# Patient Record
Sex: Male | Born: 1945
Health system: Southern US, Community
[De-identification: ages and names within clinical notes are randomized; demographics above are authoritative.]

## PROBLEM LIST (undated history)

## (undated) DIAGNOSIS — E785 Hyperlipidemia, unspecified: Secondary | ICD-10-CM

## (undated) DIAGNOSIS — I10 Essential (primary) hypertension: Secondary | ICD-10-CM

## (undated) HISTORY — DX: Hyperlipidemia, unspecified: E78.5

## (undated) HISTORY — DX: Essential (primary) hypertension: I10

---

## 2005-05-19 ENCOUNTER — Ambulatory Visit: Payer: Self-pay | Admitting: Endocrinology

## 2005-07-31 ENCOUNTER — Ambulatory Visit: Payer: Self-pay | Admitting: Endocrinology

## 2005-08-02 ENCOUNTER — Ambulatory Visit: Payer: Self-pay | Admitting: Endocrinology

## 2005-09-26 ENCOUNTER — Ambulatory Visit: Payer: Self-pay | Admitting: Endocrinology

## 2006-09-13 ENCOUNTER — Ambulatory Visit: Payer: Self-pay | Admitting: Endocrinology

## 2007-08-17 ENCOUNTER — Encounter: Payer: Self-pay | Admitting: *Deleted

## 2007-08-17 DIAGNOSIS — K279 Peptic ulcer, site unspecified, unspecified as acute or chronic, without hemorrhage or perforation: Secondary | ICD-10-CM | POA: Insufficient documentation

## 2007-08-17 DIAGNOSIS — E1159 Type 2 diabetes mellitus with other circulatory complications: Secondary | ICD-10-CM | POA: Insufficient documentation

## 2007-08-17 DIAGNOSIS — I152 Hypertension secondary to endocrine disorders: Secondary | ICD-10-CM | POA: Insufficient documentation

## 2007-08-17 DIAGNOSIS — E1169 Type 2 diabetes mellitus with other specified complication: Secondary | ICD-10-CM | POA: Insufficient documentation

## 2007-08-17 DIAGNOSIS — E785 Hyperlipidemia, unspecified: Secondary | ICD-10-CM | POA: Insufficient documentation

## 2007-08-17 DIAGNOSIS — E119 Type 2 diabetes mellitus without complications: Secondary | ICD-10-CM | POA: Insufficient documentation

## 2007-08-17 DIAGNOSIS — D72819 Decreased white blood cell count, unspecified: Secondary | ICD-10-CM | POA: Insufficient documentation

## 2007-08-17 DIAGNOSIS — I10 Essential (primary) hypertension: Secondary | ICD-10-CM

## 2007-09-11 ENCOUNTER — Ambulatory Visit: Payer: Self-pay | Admitting: Endocrinology

## 2007-09-11 LAB — CONVERTED CEMR LAB
ALT: 18 units/L (ref 0–53)
AST: 19 units/L (ref 0–37)
Albumin: 4.3 g/dL (ref 3.5–5.2)
Alkaline Phosphatase: 59 units/L (ref 39–117)
BUN: 8 mg/dL (ref 6–23)
Basophils Absolute: 0 10*3/uL (ref 0.0–0.1)
Basophils Relative: 0.3 % (ref 0.0–1.0)
Bilirubin Urine: NEGATIVE
Bilirubin, Direct: 0.1 mg/dL (ref 0.0–0.3)
CO2: 27 meq/L (ref 19–32)
Calcium: 9.5 mg/dL (ref 8.4–10.5)
Chloride: 108 meq/L (ref 96–112)
Cholesterol: 147 mg/dL (ref 0–200)
Creatinine, Ser: 1 mg/dL (ref 0.4–1.5)
Eosinophils Absolute: 0.1 10*3/uL (ref 0.0–0.6)
Eosinophils Relative: 2.7 % (ref 0.0–5.0)
GFR calc Af Amer: 97 mL/min
GFR calc non Af Amer: 80 mL/min
Glucose, Bld: 145 mg/dL — ABNORMAL HIGH (ref 70–99)
HCT: 39.4 % (ref 39.0–52.0)
HDL: 52 mg/dL (ref >39.0)
Hemoglobin, Urine: NEGATIVE
Hemoglobin: 13.3 g/dL (ref 13.0–17.0)
Hgb A1c MFr Bld: 6.1 % — ABNORMAL HIGH (ref 4.6–6.0)
Ketones, ur: NEGATIVE mg/dL
LDL Cholesterol: 88 mg/dL (ref 0–99)
Leukocytes, UA: NEGATIVE
Lymphocytes Relative: 41.6 % (ref 12.0–46.0)
MCHC: 33.7 g/dL (ref 30.0–36.0)
MCV: 82.7 fL (ref 78.0–100.0)
Monocytes Absolute: 0.4 10*3/uL (ref 0.2–0.7)
Monocytes Relative: 9.7 % (ref 3.0–11.0)
Neutro Abs: 2.2 10*3/uL (ref 1.4–7.7)
Neutrophils Relative %: 45.7 % (ref 43.0–77.0)
Nitrite: NEGATIVE
PSA: 0.91 ng/mL (ref 0.10–4.00)
Platelets: 240 10*3/uL (ref 150–400)
Potassium: 5.2 meq/L — ABNORMAL HIGH (ref 3.5–5.1)
RBC: 4.77 M/uL (ref 4.22–5.81)
RDW: 13.7 % (ref 11.5–14.6)
Sodium: 141 meq/L (ref 135–145)
Specific Gravity, Urine: 1.01 (ref 1.000–1.03)
TSH: 1.8 microintl units/mL (ref 0.35–5.50)
Total Bilirubin: 0.6 mg/dL (ref 0.3–1.2)
Total CHOL/HDL Ratio: 2.8
Total Protein, Urine: NEGATIVE mg/dL
Total Protein: 7.1 g/dL (ref 6.0–8.3)
Triglycerides: 36 mg/dL (ref 0–149)
Urine Glucose: NEGATIVE mg/dL
Urobilinogen, UA: 0.2 (ref 0.0–1.0)
VLDL: 7 mg/dL (ref 0–40)
WBC: 4.6 10*3/uL (ref 4.5–10.5)
pH: 6 (ref 5.0–8.0)

## 2008-08-10 ENCOUNTER — Telehealth: Payer: Self-pay | Admitting: Endocrinology

## 2008-09-04 ENCOUNTER — Ambulatory Visit: Payer: Self-pay | Admitting: Endocrinology

## 2008-09-08 ENCOUNTER — Encounter: Payer: Self-pay | Admitting: Endocrinology

## 2008-09-11 ENCOUNTER — Encounter: Payer: Self-pay | Admitting: Endocrinology

## 2009-11-16 ENCOUNTER — Telehealth: Payer: Self-pay | Admitting: Endocrinology

## 2009-12-09 ENCOUNTER — Ambulatory Visit: Payer: Self-pay | Admitting: Endocrinology

## 2009-12-09 DIAGNOSIS — F17200 Nicotine dependence, unspecified, uncomplicated: Secondary | ICD-10-CM

## 2009-12-14 ENCOUNTER — Telehealth: Payer: Self-pay | Admitting: Endocrinology

## 2010-03-16 ENCOUNTER — Telehealth: Payer: Self-pay | Admitting: Endocrinology

## 2010-12-11 LAB — CONVERTED CEMR LAB
ALT: 14 units/L (ref 0–53)
ALT: 14 units/L (ref 0–53)
AST: 18 units/L (ref 0–37)
AST: 23 units/L (ref 0–37)
Albumin: 4.2 g/dL (ref 3.5–5.2)
Albumin: 4.4 g/dL (ref 3.5–5.2)
Alkaline Phosphatase: 54 units/L (ref 39–117)
Alkaline Phosphatase: 56 units/L (ref 39–117)
BUN: 11 mg/dL (ref 6–23)
BUN: 9 mg/dL (ref 6–23)
Bacteria, UA: NEGATIVE
Basophils Absolute: 0 10*3/uL (ref 0.0–0.1)
Basophils Absolute: 0 10*3/uL (ref 0.0–0.1)
Basophils Relative: 0.8 % (ref 0.0–3.0)
Basophils Relative: 1.1 % (ref 0.0–3.0)
Bilirubin Urine: NEGATIVE
Bilirubin Urine: NEGATIVE
Bilirubin, Direct: 0.1 mg/dL (ref 0.0–0.3)
Bilirubin, Direct: 0.1 mg/dL (ref 0.0–0.3)
CO2: 27 meq/L (ref 19–32)
CO2: 29 meq/L (ref 19–32)
Calcium: 9.4 mg/dL (ref 8.4–10.5)
Calcium: 9.6 mg/dL (ref 8.4–10.5)
Chloride: 105 meq/L (ref 96–112)
Chloride: 109 meq/L (ref 96–112)
Cholesterol: 136 mg/dL (ref 0–200)
Cholesterol: 157 mg/dL (ref 0–200)
Creatinine, Ser: 1 mg/dL (ref 0.4–1.5)
Creatinine, Ser: 1.2 mg/dL (ref 0.4–1.5)
Creatinine,U: 24 mg/dL
Creatinine,U: 50.9 mg/dL
Crystals: NEGATIVE
Eosinophils Absolute: 0 10*3/uL (ref 0.0–0.7)
Eosinophils Absolute: 0.1 10*3/uL (ref 0.0–0.7)
Eosinophils Relative: 0.6 % (ref 0.0–5.0)
Eosinophils Relative: 1.7 % (ref 0.0–5.0)
GFR calc Af Amer: 79 mL/min
GFR calc non Af Amer: 65 mL/min
GFR calc non Af Amer: 96.47 mL/min (ref >60)
Glucose, Bld: 107 mg/dL — ABNORMAL HIGH (ref 70–99)
Glucose, Bld: 111 mg/dL — ABNORMAL HIGH (ref 70–99)
HCT: 38.5 % — ABNORMAL LOW (ref 39.0–52.0)
HCT: 41.2 % (ref 39.0–52.0)
HDL: 47.5 mg/dL (ref >39.0)
HDL: 63.6 mg/dL (ref >39.00)
Hemoglobin, Urine: NEGATIVE
Hemoglobin, Urine: NEGATIVE
Hemoglobin: 12.6 g/dL — ABNORMAL LOW (ref 13.0–17.0)
Hemoglobin: 14.2 g/dL (ref 13.0–17.0)
Hgb A1c MFr Bld: 5.9 % (ref 4.6–6.5)
Hgb A1c MFr Bld: 6 % (ref 4.6–6.0)
Ketones, ur: NEGATIVE mg/dL
Ketones, ur: NEGATIVE mg/dL
LDL Cholesterol: 102 mg/dL — ABNORMAL HIGH (ref 0–99)
LDL Cholesterol: 64 mg/dL (ref 0–99)
Leukocytes, UA: NEGATIVE
Leukocytes, UA: NEGATIVE
Lymphocytes Relative: 36.1 % (ref 12.0–46.0)
Lymphocytes Relative: 38.1 % (ref 12.0–46.0)
Lymphs Abs: 1.2 10*3/uL (ref 0.7–4.0)
MCHC: 32.6 g/dL (ref 30.0–36.0)
MCHC: 34.5 g/dL (ref 30.0–36.0)
MCV: 83.6 fL (ref 78.0–100.0)
MCV: 86.6 fL (ref 78.0–100.0)
Microalb Creat Ratio: 16.7 mg/g (ref 0.0–30.0)
Microalb Creat Ratio: 7.9 mg/g (ref 0.0–30.0)
Microalb, Ur: 0.4 mg/dL (ref 0.0–1.9)
Microalb, Ur: 0.4 mg/dL (ref 0.0–1.9)
Monocytes Absolute: 0.3 10*3/uL (ref 0.1–1.0)
Monocytes Absolute: 0.4 10*3/uL (ref 0.1–1.0)
Monocytes Relative: 10.7 % (ref 3.0–12.0)
Monocytes Relative: 9.3 % (ref 3.0–12.0)
Mucus, UA: NEGATIVE
Neutro Abs: 1.8 10*3/uL (ref 1.4–7.7)
Neutro Abs: 1.9 10*3/uL (ref 1.4–7.7)
Neutrophils Relative %: 48.4 % (ref 43.0–77.0)
Neutrophils Relative %: 53.2 % (ref 43.0–77.0)
Nitrite: NEGATIVE
Nitrite: NEGATIVE
PSA: 1.03 ng/mL (ref 0.10–4.00)
PSA: 1.14 ng/mL (ref 0.10–4.00)
Platelets: 201 10*3/uL (ref 150.0–400.0)
Platelets: 207 10*3/uL (ref 150–400)
Potassium: 3.9 meq/L (ref 3.5–5.1)
Potassium: 4.5 meq/L (ref 3.5–5.1)
RBC: 4.44 M/uL (ref 4.22–5.81)
RBC: 4.93 M/uL (ref 4.22–5.81)
RDW: 14.2 % (ref 11.5–14.6)
RDW: 14.2 % (ref 11.5–14.6)
Sodium: 141 meq/L (ref 135–145)
Sodium: 142 meq/L (ref 135–145)
Specific Gravity, Urine: 1.01 (ref 1.000–1.030)
Specific Gravity, Urine: <=1.005 (ref 1.000–1.03)
Squamous Epithelial / LPF: NEGATIVE /lpf
TSH: 1.17 microintl units/mL (ref 0.35–5.50)
TSH: 1.77 microintl units/mL (ref 0.35–5.50)
Total Bilirubin: 0.8 mg/dL (ref 0.3–1.2)
Total Bilirubin: 0.8 mg/dL (ref 0.3–1.2)
Total CHOL/HDL Ratio: 2
Total CHOL/HDL Ratio: 3.3
Total Protein, Urine: NEGATIVE mg/dL
Total Protein, Urine: NEGATIVE mg/dL
Total Protein: 6.9 g/dL (ref 6.0–8.3)
Total Protein: 7.6 g/dL (ref 6.0–8.3)
Triglycerides: 39 mg/dL (ref 0–149)
Triglycerides: 42 mg/dL (ref 0.0–149.0)
Urine Glucose: NEGATIVE mg/dL
Urine Glucose: NEGATIVE mg/dL
Urobilinogen, UA: 0.2 (ref 0.0–1.0)
Urobilinogen, UA: 0.2 (ref 0.0–1.0)
VLDL: 8 mg/dL (ref 0–40)
VLDL: 8.4 mg/dL (ref 0.0–40.0)
WBC: 3.4 10*3/uL — ABNORMAL LOW (ref 4.5–10.5)
WBC: 3.7 10*3/uL — ABNORMAL LOW (ref 4.5–10.5)
pH: 6 (ref 5.0–8.0)
pH: 6 (ref 5.0–8.0)

## 2010-12-15 NOTE — Medication Information (Signed)
Summary: Authorization not processed - Coverage no longer valid with [employer]  Authorization not processed - Coverage no longer valid with [facility]   Imported By: Maryln Gottron 09/17/2008 16:49:47  _____________________________________________________________________  External Attachment:    Type:   Image     Comment:   External Document

## 2010-12-15 NOTE — Assessment & Plan Note (Signed)
Summary: cpx/medicare/lab same day/$50/pn   Vital Signs:  Patient Profile:   65 Years Old Male Weight:      160.6 pounds O2 Sat:      98 % O2 treatment:    Room Air Temp:     96.9 degrees F oral Pulse rate:   92 / minute BP sitting:   162 / 94  (left arm) Cuff size:   regular  Pt. in pain?   no  Vitals Entered By: Orlan Leavens (September 04, 2008 11:13 AM)                  Chief Complaint:  cpx.  History of Present Illness: smoker cigar 1/week.  etoh is 1/week    Prior Medications Reviewed Using: Patient Recall  Updated Prior Medication List: GLUCOPHAGE 1000 MG  TABS (METFORMIN HCL) take 1 by mouth two times a day once daily Physical is due in October ADULT ASPIRIN LOW STRENGTH 81 MG  TBDP (ASPIRIN) take 1 by mouth qd DIOVAN HCT 160-25 MG TABS (VALSARTAN-HYDROCHLOROTHIAZIDE) take 1 by mouth qd  Current Allergies (reviewed today): ! 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     Physical Exam  General:     well developed, well nourished, in no acute distress Head:     head is normocephalic eyes: no scleral icterus no periorbital swelling perrl external ears are normal nose normal externally mouth has no lesion, including normal tongue  Neck:     no masses, thyromegaly, or abnormal cervical nodes Heart:     regular rate and rhythm, S1, S2 without murmurs, rubs, gallops, or clicks Abdomen:     abdomen is soft, nontender.  no hepatosplenomegaly.   not distended.  no hernia  Rectal:     normal external and internal  exam.  heme neg  Prostate:     normal Msk:     no deformity or scoliosis noted with normal posture and gait Extremities:     no deformity.  no ulcer on the feet.  feet are of normal color and temp.  no edema  Neurologic:     cn 2-12 grossly intact.   readily moves all 4's.   sensation is intact to touch on the feet  Skin:     normal texture and temp.  no rash.  not diaphoretic  Cervical Nodes:     no significant adenopathy Psych:     alert and cooperative; normal mood and affect; normal attention span and concentration Additional Exam:     SEPARATE EVALUATION FOLLOWS--EACH PROBLEM HERE IS NEW, NOT RESPONDING TO TREATMENT, OR POSES SIGNIFICANT RISK TO THE PATIENT'S HEALTH: HISTORY OF THE PRESENT ILLNESS: takes diovan hct as rx'ed PAST MEDICAL HISTORY reviewed and up to date today REVIEW OF SYSTEMS: denies sob PHYSICAL EXAMINATION: clear to auscultation.  no respiratory distress dorsalis pedis intact bilat.  no carotid bruit LAB/XRAY RESULTS: bmet is normal IMPRESSION: needs increased rx of htn PLAN: increase diovan-hct to 320/25, 1/d    Impression & Recommendations:  Problem #  1:  ROUTINE GENERAL MEDICAL EXAM@HEALTH  CARE FACL (ICD-V70.0)  Medications Added to Medication List This Visit: 1)  Diovan Hct 160-25 Mg Tabs (Valsartan-hydrochlorothiazide) .... Take 1 by mouth qd 2)  Diovan Hct 320-25 Mg Tabs (Valsartan-hydrochlorothiazide) .... Qd  Other Orders: EKG w/ Interpretation (93000) Admin 1st Vaccine ([address]) Flu Vaccine 44yrs + ([address]) Tetanus Toxoid w/Dx (09811) Admin of Any Addtl Vaccine (91478) TLB-TSH (Thyroid Stimulating Hormone) (84443-TSH) TLB-CBC Platelet - w/Differential (85025-CBCD) TLB-BMP (Basic Metabolic Panel-BMET) (80048-METABOL) TLB-Lipid Panel (80061-LIPID) TLB-A1C / Hgb A1C (Glycohemoglobin) (83036-A1C) TLB-Microalbumin/Creat Ratio, Urine (82043-MALB) TLB-PSA (Prostate Specific Antigen) (84153-PSA) TLB-Udip w/ Micro (81001-URINE) Est.  Patient Level III ([address])   Patient Instructions: 1)  we discussed the recommendations of the preventive services task force 2)  i strongly advised screening colonoscopy.  pt says he'll consider   Prescriptions: GLUCOPHAGE 1000 MG  TABS (METFORMIN HCL) take 1 by mouth two times a day once daily Physical is due in October  #60 x 11   Entered and Authorized by:   Minus Breeding MD   Signed by:   Minus Breeding MD on 09/04/2008   Method used:   [facility] to        [facility]  [address] [id number]       [address]       [place], [place]  [address]       Ph: [phone] or [phone]       Fax: [phone]   RxID:   [id number] DIOVAN HCT 320-25 MG TABS (VALSARTAN-HYDROCHLOROTHIAZIDE) qd  #30 x 11   Entered and Authorized by:   Minus Breeding MD   Signed by:   Minus Breeding MD on 09/04/2008   Method used:   [facility] to        [facility] [id number]       [address]       [place], [place]  [address]       Ph: [phone] or [phone]       Fax: [phone]   RxID:   [id number]  ]   Flu Vaccine Consent Questions     Do you have a history of severe allergic reactions to this vaccine? no    Any prior history of allergic reactions to egg and/or gelatin? no    Do you have a sensitivity to the preservative Thimersol? no    Do you have a past history of Guillan-Barre Syndrome? no    Do you currently have an acute febrile illness? no    Have you ever had a severe reaction to latex? no    Vaccine information given and explained to patient? yes    Are you currently pregnant? no    Lot Number:[id number]   Exp Date:05/12/2009   Site Given right Deltoid IM   Tetanus/Td Vaccine    Vaccine Type: Td    Site: left deltoid    Dose: 0.5 ml    Route: IM    Given by: Orlan Leavens    Exp. Date: 02/16/2010    Lot #: [id number]    VIS given: 09/04/08

## 2010-12-15 NOTE — Progress Notes (Signed)
Summary: metformin  Phone Note Refill Request Message from:  Fax from Pharmacy on August 10, 2008 11:44 AM  Refills Requested: Medication #1:  GLUCOPHAGE 1000 MG  TABS take 1 by mouth two times a day qd   Last Refilled: 08/04/2008  Method Requested: Electronic Initial call taken by: Orlan Leavens,  August 10, 2008 11:44 AM    New/Updated Medications: GLUCOPHAGE 1000 MG  TABS (METFORMIN HCL) take 1 by mouth two times a day once daily Physical is due in October   Prescriptions: GLUCOPHAGE 1000 MG  TABS (METFORMIN HCL) take 1 by mouth two times a day once daily Physical is due in October  #60 x 0   Entered by:   Orlan Leavens   Authorized by:   Minus Breeding MD   Signed by:   Orlan Leavens on 08/10/2008   Method used:   [facility]        [facility] [id number]       [address]       [place], [place]  [address]       Ph: [phone] or [phone]       Fax: [phone]   RxID:   [id number]

## 2010-12-15 NOTE — Progress Notes (Signed)
Summary: glucophage  Phone Note Refill Request Message from:  Fax from Pharmacy on Mar 16, 2010 10:59 AM  Refills Requested: Medication #1:  GLUCOPHAGE 1000 MG  TABS take 1 by mouth two times a day  Method Requested: Electronic Initial call taken by: Orlan Leavens,  Mar 16, 2010 10:59 AM    Prescriptions: GLUCOPHAGE 1000 MG  TABS (METFORMIN HCL) take 1 by mouth two times a day  #60 x 9   Entered by:   Orlan Leavens   Authorized by:   Minus Breeding MD   Signed by:   Orlan Leavens on 03/16/2010   Method used:   Electronically to        [facility] [address] [facility] (retail)       [address]       [place], [place]  [address]       Ph: [phone]       Fax: [phone]   RxID:   [id number]

## 2010-12-15 NOTE — Progress Notes (Signed)
  Phone Note Refill Request Message from:  Fax from Pharmacy on December 14, 2009 4:35 PM  Refills Requested: Medication #1:  GLUCOPHAGE 1000 MG  TABS take 1 by mouth two times a day   Dosage confirmed as above?Dosage Confirmed Pt wants RX [facility] [address]  Initial call taken by: Josph Macho CMA,  December 14, 2009 4:35 PM    Prescriptions: GLUCOPHAGE 1000 MG  TABS (METFORMIN HCL) take 1 by mouth two times a day  #60 x 3   Entered by:   Josph Macho CMA   Authorized by:   Minus Breeding MD   Signed by:   Josph Macho CMA on 12/14/2009   Method used:   Electronically to        [facility] (retail)       [address]       [place], [place]  [address]       Ph: [phone]       Fax: [phone]   RxID:   [id number]

## 2010-12-15 NOTE — Miscellaneous (Signed)
   Clinical Lists Changes  Allergies: Changed allergy or adverse reaction from ACE INHIBITORS to ACE INHIBITORS

## 2010-12-15 NOTE — Progress Notes (Signed)
Summary: rx refill  Phone Note Call from Patient Call back at Home Phone [phone]   Caller: Patient Summary of Call: pt called requesting refills of Metformin to last until CPX 01.27/2011. 30 day supply sent. pt informed and will get 1 year supply at CPX Initial call taken by: Margaret Pyle, CMA,  November 16, 2009 9:46 AM    Prescriptions: GLUCOPHAGE 1000 MG  TABS (METFORMIN HCL) take 1 by mouth two times a day once daily Physical is due in October  #60 Tablet x 0   Entered by:   Margaret Pyle, CMA   Authorized by:   Minus Breeding MD   Signed by:   Margaret Pyle, CMA on 11/16/2009   Method used:   [facility] [id number]       [address]       [place], [place]  [address]       Ph: [phone] or [phone]       Fax: [phone]   RxID:   [id number]

## 2010-12-15 NOTE — Medication Information (Signed)
Summary: Denied Prior Authorization Request Form for Rx Diovan/[facility]  Denied Prior Authorization Request Form for Rx Diovan/[facility]   Imported By: Esmeralda Links D'jimraou 09/15/2008 14:56:44  _____________________________________________________________________  External Attachment:    Type:   Image     Comment:   External Document

## 2010-12-15 NOTE — Assessment & Plan Note (Signed)
Summary: CPX/BCBS/#/CD   Vital Signs:  Patient profile:   65 year old male Height:      68 inches (172.72 cm) Weight:      156 pounds (70.91 kg) BMI:     23.81 O2 Sat:      98 % on Room air Temp:     97.7 degrees F (36.50 degrees C) oral Pulse rate:   102 / minute BP sitting:   180 / 94  (left arm) Cuff size:   regular  Vitals Entered By: Josph Macho CMA (December 09, 2009 11:35 AM)  O2 Flow:  Room air CC: Physical/ pt needs refills on meds/ flu vax today/ CF Is Patient Diabetic? Yes   CC:  Physical/ pt needs refills on meds/ flu vax today/ CF.  History of Present Illness: here for regular wellness examination.  He's feeling pretty well in general.   Current Medications (verified): 1)  Glucophage 1000 Mg  Tabs (Metformin Hcl) .... Take 1 By Mouth Two Times A Day Once Daily Physical Is Due in October 2)  Adult Aspirin Low Strength 81 Mg  Tbdp (Aspirin) .... Take 1 By Mouth Qd 3)  Diovan Hct 320-25 Mg Tabs (Valsartan-Hydrochlorothiazide) .... Qd 4)  Simvastatin 40 Mg Tabs (Simvastatin) .... Qhs  Allergies (verified): 1)  ! Ace Inhibitors  Past History:  Past Medical History: Last updated: 08/17/2007 Diabetes mellitus, type II Hyperlipidemia Hypertension  Family History: Reviewed history from 09/04/2008 and no changes required. no cancer  Social History: Reviewed history from 09/04/2008 and no changes required. 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    Physical Exam  General:  normal appearance.   Head:  head: no deformity eyes: no periorbital swelling, no proptosis external nose and ears are normal mouth: no lesion seen Neck:  Supple without  thyroid enlargement or tenderness. No cervical lymphadenopathy Lungs:  Clear to auscultation bilaterally. Normal respiratory effort.  Heart:  Regular rate and rhythm without murmurs or gallops noted. Normal S1,S2.   Abdomen:  abdomen is soft, nontender.  no hepatosplenomegaly.   not distended.  no hernia  Rectal:  normal external and internal exam.  heme neg  Prostate:  Normal size prostate without masses or tenderness.  Msk:  muscle bulk and strength are grossly normal.  no obvious joint swelling.  gait is normal and steady  Pulses:  dorsalis pedis intact bilat.  no carotid bruit  Extremities:  no deformity.  no ulcer on the feet.  feet are of normal color and temp.  no edema  Neurologic:  cn 2-12 grossly intact.   readily moves all 4's.   sensation is intact to touch on the feet  Skin:  normal texture and temp.  no rash.  not diaphoretic  Cervical Nodes:  No significant adenopathy.  Psych:  Alert and cooperative; normal mood and affect; normal attention span and concentration.     Impression & Recommendations:  Problem # 1:  ROUTINE GENERAL MEDICAL EXAM@HEALTH  CARE FACL (ICD-V70.0)  Orders: Est. Patient 40-64 years ([address])  Medications Added to Medication List This Visit: 1)  Glucophage 1000 Mg Tabs (Metformin hcl) .... Take 1 by mouth two times a day  Other Orders: Admin 1st Vaccine ([address]) Flu Vaccine 23yrs + (09811) TLB-Lipid Panel (80061-LIPID) TLB-BMP (Basic Metabolic  Panel-BMET) (80048-METABOL) TLB-CBC Platelet - w/Differential (85025-CBCD) TLB-Hepatic/Liver Function Pnl (80076-HEPATIC) TLB-TSH (Thyroid Stimulating Hormone) (84443-TSH) TLB-A1C / Hgb A1C (Glycohemoglobin) (83036-A1C) TLB-Microalbumin/Creat Ratio, Urine (82043-MALB) TLB-PSA (Prostate Specific Antigen) (84153-PSA) TLB-Udip w/ Micro (81001-URINE) Flu Vaccine Consent Questions     Do you have a history of severe allergic reactions to this vaccine? no    Any prior history of allergic reactions to egg  and/or gelatin? no    Do you have a sensitivity to the preservative Thimersol? no    Do you have a past history of Guillan-Barre Syndrome? no    Do you currently have an acute febrile illness? no    Have you ever had a severe reaction to latex? no    Vaccine information given and explained to patient? yes    Are you currently pregnant? no    Lot Number:[id number]   Exp Date:05/12/2010   Site Given  Left Deltoid IM Flu Vaccine 27yrs + ([address])  Patient Instructions: 1)  return 6 months 2)  tests are being ordered for you today.  a few days after the test(s), please call [phone] to hear your test results. 3)  you should avoid smoking cigars. 4)  (update: i left message on phone-tree:  please come in for cbc iron panel b-12 (all 285.9) Prescriptions: SIMVASTATIN 40 MG TABS (SIMVASTATIN) qhs  #90 x 3   Entered and Authorized by:   Minus Breeding MD   Signed by:   Minus Breeding MD on 12/09/2009   Method used:   Electronically to        [facility] [place] (retail)       [address]       [place], [place]  [address]       Ph: [phone]       Fax: [phone]   RxID:   [id number] SIMVASTATIN 40 MG TABS (SIMVASTATIN) qhs  #90 x 3   Entered and Authorized by:   Minus Breeding MD   Signed by:   Minus Breeding MD on 12/09/2009   Method used:   Print then Give to Patient   RxID:   [id number]  .lbflu

## 2011-01-16 ENCOUNTER — Telehealth: Payer: Self-pay | Admitting: Endocrinology

## 2011-01-24 NOTE — Progress Notes (Signed)
Summary: rx refill req  Phone Note Refill Request Message from:  Pharmacy on January 16, 2011 12:04 PM  Refills Requested: Medication #1:  GLUCOPHAGE 1000 MG  TABS take 1 by mouth two times a day   Dosage confirmed as above?Dosage Confirmed   Last Refilled: 01/14/2011  Method Requested: Electronic Next Appointment Scheduled: none Initial call taken by: Brenton Grills CMA (AAMA),  January 16, 2011 12:05 PM    Prescriptions: GLUCOPHAGE 1000 MG  TABS (METFORMIN HCL) take 1 by mouth two times a day  #60 x 0   Entered by:   Brenton Grills CMA (AAMA)   Authorized by:   Minus Breeding MD   Signed by:   Brenton Grills CMA (AAMA) on 01/16/2011   Method used:   Electronically to        [facility] [place] (retail)       [address]       [place], [place]  [address]       Ph: [phone]       Fax: [phone]   RxID:   [id number]

## 2011-02-10 ENCOUNTER — Other Ambulatory Visit: Payer: Self-pay

## 2011-02-10 MED ORDER — METFORMIN HCL 1000 MG PO TABS: 1000.0000 mg | ORAL_TABLET | Freq: Two times a day (BID) | ORAL | Status: DC

## 2011-02-10 NOTE — Telephone Encounter (Signed)
Pt called requesting refill, appt scheduled 04/16

## 2011-03-06 ENCOUNTER — Encounter: Payer: Self-pay | Admitting: Endocrinology

## 2011-03-06 ENCOUNTER — Other Ambulatory Visit (INDEPENDENT_AMBULATORY_CARE_PROVIDER_SITE_OTHER): Payer: PRIVATE HEALTH INSURANCE

## 2011-03-06 ENCOUNTER — Ambulatory Visit (INDEPENDENT_AMBULATORY_CARE_PROVIDER_SITE_OTHER): Payer: PRIVATE HEALTH INSURANCE | Admitting: Endocrinology

## 2011-03-06 DIAGNOSIS — D72819 Decreased white blood cell count, unspecified: Secondary | ICD-10-CM

## 2011-03-06 DIAGNOSIS — E119 Type 2 diabetes mellitus without complications: Secondary | ICD-10-CM

## 2011-03-06 DIAGNOSIS — Z0000 Encounter for general adult medical examination without abnormal findings: Secondary | ICD-10-CM

## 2011-03-06 DIAGNOSIS — Z125 Encounter for screening for malignant neoplasm of prostate: Secondary | ICD-10-CM

## 2011-03-06 DIAGNOSIS — E785 Hyperlipidemia, unspecified: Secondary | ICD-10-CM

## 2011-03-06 DIAGNOSIS — Z23 Encounter for immunization: Secondary | ICD-10-CM

## 2011-03-06 DIAGNOSIS — I10 Essential (primary) hypertension: Secondary | ICD-10-CM

## 2011-03-06 DIAGNOSIS — Z79899 Other long term (current) drug therapy: Secondary | ICD-10-CM

## 2011-03-06 LAB — BASIC METABOLIC PANEL
BUN: 11 mg/dL (ref 6–23)
CO2: 26 mEq/L (ref 19–32)
Calcium: 9.6 mg/dL (ref 8.4–10.5)
Chloride: 100 mEq/L (ref 96–112)
Creatinine, Ser: 1.2 mg/dL (ref 0.4–1.5)
GFR: 74.97 mL/min (ref >60.00)
Glucose, Bld: 138 mg/dL — ABNORMAL HIGH (ref 70–99)
Potassium: 4.8 mEq/L (ref 3.5–5.1)
Sodium: 135 mEq/L (ref 135–145)

## 2011-03-06 LAB — CBC WITH DIFFERENTIAL/PLATELET
Basophils Absolute: 0 10*3/uL (ref 0.0–0.1)
Basophils Relative: 0.4 % (ref 0.0–3.0)
Eosinophils Absolute: 0.1 10*3/uL (ref 0.0–0.7)
Eosinophils Relative: 1.8 % (ref 0.0–5.0)
HCT: 38.9 % — ABNORMAL LOW (ref 39.0–52.0)
Hemoglobin: 13.3 g/dL (ref 13.0–17.0)
Lymphocytes Relative: 32.7 % (ref 12.0–46.0)
Lymphs Abs: 1 10*3/uL (ref 0.7–4.0)
MCHC: 34.1 g/dL (ref 30.0–36.0)
MCV: 84.1 fl (ref 78.0–100.0)
Monocytes Absolute: 0.3 10*3/uL (ref 0.1–1.0)
Monocytes Relative: 9.2 % (ref 3.0–12.0)
Neutro Abs: 1.7 10*3/uL (ref 1.4–7.7)
Neutrophils Relative %: 55.9 % (ref 43.0–77.0)
Platelets: 253 10*3/uL (ref 150.0–400.0)
RBC: 4.62 Mil/uL (ref 4.22–5.81)
RDW: 15.6 % — ABNORMAL HIGH (ref 11.5–14.6)
WBC: 3.1 10*3/uL — ABNORMAL LOW (ref 4.5–10.5)

## 2011-03-06 LAB — HEPATIC FUNCTION PANEL
ALT: 11 U/L (ref 0–53)
AST: 18 U/L (ref 0–37)
Albumin: 4.4 g/dL (ref 3.5–5.2)
Alkaline Phosphatase: 52 U/L (ref 39–117)
Bilirubin, Direct: 0.1 mg/dL (ref 0.0–0.3)
Total Bilirubin: 0.6 mg/dL (ref 0.3–1.2)
Total Protein: 7.2 g/dL (ref 6.0–8.3)

## 2011-03-06 LAB — LIPID PANEL
Cholesterol: 157 mg/dL (ref 0–200)
HDL: 69.4 mg/dL (ref >39.00)
LDL Cholesterol: 82 mg/dL (ref 0–99)
Total CHOL/HDL Ratio: 2
Triglycerides: 30 mg/dL (ref 0.0–149.0)
VLDL: 6 mg/dL (ref 0.0–40.0)

## 2011-03-06 LAB — HEMOGLOBIN A1C: Hgb A1c MFr Bld: 6 % (ref 4.6–6.5)

## 2011-03-06 LAB — MICROALBUMIN / CREATININE URINE RATIO
Creatinine,U: 35.1 mg/dL
Microalb Creat Ratio: 0.3 mg/g (ref 0.0–30.0)
Microalb, Ur: 0.1 mg/dL (ref 0.0–1.9)

## 2011-03-06 LAB — PSA: PSA: 1.44 ng/mL (ref 0.10–4.00)

## 2011-03-06 LAB — TSH: TSH: 1.91 u[IU]/mL (ref 0.35–5.50)

## 2011-03-06 MED ORDER — SIMVASTATIN 40 MG PO TABS: 40.0000 mg | ORAL_TABLET | Freq: Every day | ORAL | Status: DC

## 2011-03-06 MED ORDER — METFORMIN HCL 1000 MG PO TABS: 1000.0000 mg | ORAL_TABLET | Freq: Two times a day (BID) | ORAL | Status: DC

## 2011-03-06 NOTE — Patient Instructions (Addendum)
blood tests are being ordered for you today.  please call [phone] to hear your test results. please consider these measures for your health:  minimize alcohol.  do not use tobacco products.  have a colonoscopy at least every 10 years from age 66.  keep firearms safely stored.  always use seat belts.  have working smoke alarms in your home.  see an eye doctor and dentist regularly.  never drive under the influence of alcohol or drugs (including prescription drugs).  those with fair skin should take precautions against the sun. please let me know what your wishes would be, if artificial life support measures should become necessary.  it is critically important to prevent falling down (keep floor areas well-lit, dry, and free of loose objects) Please return in 1 year. i have requested a colonoscopy for you.  you will be called with a day and time for an appointment (update: i left message on phone-tree:  rx as we discussed)

## 2011-03-06 NOTE — Progress Notes (Signed)
  Subjective:    Patient ID: Ricardo Dawson, male    DOB: 03/19/1946, 65 y.o.   MRN: [id number]  HPI here for regular wellness examination.  He's feeling pretty well in general, and says chronic med probs are stable, except as noted below Past Medical History  Diagnosis Date  . Diabetes mellitus     Type II  . Hyperlipidemia   . Hypertension    No past surgical history on file.  does not have a smoking history on file. He does not have any smokeless tobacco history on file. He reports that he drinks alcohol. He reports that he does not use illicit drugs. Married Retired  family history is negative for Cancer. Allergies  Allergen Reactions  . Ace Inhibitors     REACTION: cough 2002--altace      Review of Systems  Constitutional: Negative for unexpected weight change.  HENT: Negative for hearing loss.   Eyes: Negative for visual disturbance.  Respiratory: Negative for shortness of breath.   Cardiovascular: Negative for chest pain.  Gastrointestinal: Negative for blood in stool.  Genitourinary: Negative for hematuria.  Musculoskeletal: Negative for arthralgias.  Skin: Negative for rash.  Neurological: Negative for syncope.  Hematological: Does not bruise/bleed easily.  Psychiatric/Behavioral: Negative for dysphoric mood. The patient is not nervous/anxious.        Objective:   Physical Exam VS: see vs page GEN: no distress HEAD: head: no deformity eyes: no periorbital swelling, no proptosis external nose and ears are normal mouth: no lesion seen NECK: supple, thyroid is not enlarged CHEST WALL: no deformity BREASTS:  No gynecomastia CV: reg rate and rhythm, no murmur ABD: abdomen is soft, nontender.  no hepatosplenomegaly.  not distended.  no hernia RECTAL: normal external and internal exam.  heme neg. PROSTATE:  Normal size.  No nodule MUSCULOSKELETAL: muscle bulk and strength are grossly normal.  no obvious joint swelling.  gait is normal and steady EXTEMITIES:  no deformity.  no ulcer on the feet.  feet are of normal color and temp.  no edema PULSES: dorsalis pedis intact bilat.  no carotid bruit NEURO:  cn 2-12 grossly intact.   readily moves all 4's.  sensation is intact to touch on the feet SKIN:  Normal texture and temperature.  No rash or suspicious lesion is visible.   NODES:  None palpable at the neck PSYCH: alert, oriented x3.  Does not appear anxious nor depressed.        Assessment & Plan:  Wellness visit today, with problems stable except as noted.

## 2011-12-19 ENCOUNTER — Other Ambulatory Visit: Payer: Self-pay

## 2011-12-19 MED ORDER — SIMVASTATIN 40 MG PO TABS: 40.0000 mg | ORAL_TABLET | Freq: Every day | ORAL | Status: DC

## 2012-02-08 ENCOUNTER — Other Ambulatory Visit: Payer: Self-pay | Admitting: Endocrinology

## 2012-04-09 ENCOUNTER — Other Ambulatory Visit: Payer: Self-pay | Admitting: *Deleted

## 2012-04-09 MED ORDER — METFORMIN HCL 1000 MG PO TABS: ORAL_TABLET | ORAL | Status: DC

## 2012-04-09 NOTE — Telephone Encounter (Signed)
R"cd fax from [facility] for refill of Metformin.

## 2012-05-07 ENCOUNTER — Ambulatory Visit (INDEPENDENT_AMBULATORY_CARE_PROVIDER_SITE_OTHER): Payer: Medicare Other | Admitting: Endocrinology

## 2012-05-07 ENCOUNTER — Other Ambulatory Visit (INDEPENDENT_AMBULATORY_CARE_PROVIDER_SITE_OTHER): Payer: Medicare Other

## 2012-05-07 ENCOUNTER — Encounter: Payer: Self-pay | Admitting: Endocrinology

## 2012-05-07 VITALS — BP 148/96 | HR 84 | Temp 97.3°F | Ht 67.0 in | Wt 152.0 lb

## 2012-05-07 DIAGNOSIS — Z79899 Other long term (current) drug therapy: Secondary | ICD-10-CM

## 2012-05-07 DIAGNOSIS — D72819 Decreased white blood cell count, unspecified: Secondary | ICD-10-CM | POA: Diagnosis not present

## 2012-05-07 DIAGNOSIS — I10 Essential (primary) hypertension: Secondary | ICD-10-CM | POA: Diagnosis not present

## 2012-05-07 DIAGNOSIS — E119 Type 2 diabetes mellitus without complications: Secondary | ICD-10-CM

## 2012-05-07 DIAGNOSIS — Z125 Encounter for screening for malignant neoplasm of prostate: Secondary | ICD-10-CM

## 2012-05-07 DIAGNOSIS — E785 Hyperlipidemia, unspecified: Secondary | ICD-10-CM

## 2012-05-07 DIAGNOSIS — Z0000 Encounter for general adult medical examination without abnormal findings: Secondary | ICD-10-CM

## 2012-05-07 LAB — CBC WITH DIFFERENTIAL/PLATELET
Basophils Absolute: 0 10*3/uL (ref 0.0–0.1)
Basophils Relative: 0.6 % (ref 0.0–3.0)
Eosinophils Absolute: 0 10*3/uL (ref 0.0–0.7)
Eosinophils Relative: 0.7 % (ref 0.0–5.0)
HCT: 37.9 % — ABNORMAL LOW (ref 39.0–52.0)
Hemoglobin: 12.5 g/dL — ABNORMAL LOW (ref 13.0–17.0)
Lymphocytes Relative: 31.9 % (ref 12.0–46.0)
Lymphs Abs: 0.9 10*3/uL (ref 0.7–4.0)
MCHC: 33 g/dL (ref 30.0–36.0)
MCV: 84.6 fl (ref 78.0–100.0)
Monocytes Absolute: 0.3 10*3/uL (ref 0.1–1.0)
Monocytes Relative: 10.3 % (ref 3.0–12.0)
Neutro Abs: 1.6 10*3/uL (ref 1.4–7.7)
Neutrophils Relative %: 56.5 % (ref 43.0–77.0)
Platelets: 206 10*3/uL (ref 150.0–400.0)
RBC: 4.48 Mil/uL (ref 4.22–5.81)
RDW: 15.4 % — ABNORMAL HIGH (ref 11.5–14.6)
WBC: 2.8 10*3/uL — ABNORMAL LOW (ref 4.5–10.5)

## 2012-05-07 LAB — HEPATIC FUNCTION PANEL
ALT: 13 U/L (ref 0–53)
AST: 18 U/L (ref 0–37)
Albumin: 4.4 g/dL (ref 3.5–5.2)
Alkaline Phosphatase: 45 U/L (ref 39–117)
Bilirubin, Direct: 0.1 mg/dL (ref 0.0–0.3)
Total Bilirubin: 0.8 mg/dL (ref 0.3–1.2)
Total Protein: 7.3 g/dL (ref 6.0–8.3)

## 2012-05-07 LAB — LIPID PANEL
Cholesterol: 93 mg/dL (ref 0–200)
HDL: 65.5 mg/dL (ref >39.00)
LDL Cholesterol: 23 mg/dL (ref 0–99)
Total CHOL/HDL Ratio: 1
Triglycerides: 23 mg/dL (ref 0.0–149.0)
VLDL: 4.6 mg/dL (ref 0.0–40.0)

## 2012-05-07 LAB — BASIC METABOLIC PANEL
BUN: 7 mg/dL (ref 6–23)
CO2: 30 mEq/L (ref 19–32)
Calcium: 9.6 mg/dL (ref 8.4–10.5)
Chloride: 105 mEq/L (ref 96–112)
Creatinine, Ser: 1.1 mg/dL (ref 0.4–1.5)
GFR: 87.62 mL/min (ref >60.00)
Glucose, Bld: 100 mg/dL — ABNORMAL HIGH (ref 70–99)
Potassium: 4.7 mEq/L (ref 3.5–5.1)
Sodium: 142 mEq/L (ref 135–145)

## 2012-05-07 LAB — HEMOGLOBIN A1C: Hgb A1c MFr Bld: 6 % (ref 4.6–6.5)

## 2012-05-07 LAB — URINALYSIS, ROUTINE W REFLEX MICROSCOPIC
Bilirubin Urine: NEGATIVE
Hgb urine dipstick: NEGATIVE
Ketones, ur: NEGATIVE
Leukocytes, UA: NEGATIVE
Nitrite: NEGATIVE
Specific Gravity, Urine: <=1.005 (ref 1.000–1.030)
Total Protein, Urine: NEGATIVE
Urine Glucose: NEGATIVE
Urobilinogen, UA: 0.2 (ref 0.0–1.0)
pH: 6 (ref 5.0–8.0)

## 2012-05-07 LAB — PSA, MEDICARE: PSA: 1.17 ng/mL (ref <=4.00)

## 2012-05-07 LAB — MICROALBUMIN / CREATININE URINE RATIO
Creatinine,U: 29.3 mg/dL
Microalb Creat Ratio: 2.4 mg/g (ref 0.0–30.0)
Microalb, Ur: 0.7 mg/dL (ref 0.0–1.9)

## 2012-05-07 LAB — TSH: TSH: 1.67 u[IU]/mL (ref 0.35–5.50)

## 2012-05-07 MED ORDER — METFORMIN HCL 1000 MG PO TABS: ORAL_TABLET | ORAL | Status: DC

## 2012-05-07 MED ORDER — VALSARTAN-HYDROCHLOROTHIAZIDE 320-25 MG PO TABS: 1.0000 | ORAL_TABLET | Freq: Every day | ORAL | Status: DC

## 2012-05-07 MED ORDER — SIMVASTATIN 40 MG PO TABS: 40.0000 mg | ORAL_TABLET | Freq: Every day | ORAL | Status: DC

## 2012-05-07 NOTE — Patient Instructions (Addendum)
Please come back soon for a "medicare wellness" visit.  We'll recheck your blood-pressure then.  blood tests are being requested for you today.  You will receive a letter with results.

## 2012-05-07 NOTE — Progress Notes (Signed)
  Subjective:    Patient ID: Ricardo Dawson, male    DOB: [birth year], 66 y.o.   MRN: [id number]  HPI The state of at least three ongoing medical problems is addressed today: HTN: Pt says today's bp elev is situational.  Denies chest pain DM: Denies weight change Leukopenia: denies fever Past Medical History  Diagnosis Date  . Diabetes mellitus     Type II  . Hyperlipidemia   . Hypertension     No past surgical history on file.  History   Social History  . Marital Status: Married    Spouse Name: N/A    Number of Children: N/A  . Years of Education: N/A   Occupational History  . Not on file.   Social History Main Topics  . Smoking status: Current Some Day Smoker    Types: Cigars  . Smokeless tobacco: Not on file  . Alcohol Use: Yes     Rare  . Drug Use: No  . Sexually Active: Not on file   Other Topics Concern  . Not on file   Social History Narrative  . No narrative on file    Current Outpatient Prescriptions on File Prior to Visit  Medication Sig Dispense Refill  . aspirin 81 MG tablet Take 81 mg by mouth daily.        . metFORMIN (GLUCOPHAGE) 1000 MG tablet TAKE 1 TABLET (1,000 MG TOTAL) BY MOUTH 2 (TWO) TIMES DAILY WITH A MEAL.  60 tablet  5  . simvastatin (ZOCOR) 40 MG tablet Take 1 tablet (40 mg total) by mouth at bedtime.  90 tablet  1  . valsartan-hydrochlorothiazide (DIOVAN-HCT) 320-25 MG per tablet Take 1 tablet by mouth daily.  30 tablet  5    Allergies  Allergen Reactions  . Ace Inhibitors     REACTION: cough 2002--altace    Family History  Problem Relation Age of Onset  . Cancer Neg Hx     BP 148/96  Pulse 84  Temp 97.3 F (36.3 C) (Oral)  Ht 5\' 7"  (1.702 m)  Wt 152 lb (68.947 kg)  BMI 23.81 kg/m2  SpO2 97%  Review of Systems Denies sob and brbpr.    Objective:   Physical Exam VITAL SIGNS:  See vs page GENERAL: no distress Pulses: dorsalis pedis intact bilat.   Feet: no deformity.  no ulcer on the feet.  feet are of normal  color and temp.  no edema Neuro: sensation is intact to touch on the feet. Lab Results  Component Value Date   WBC 2.8* 05/07/2012   HGB 12.5* 05/07/2012   HCT 37.9* 05/07/2012   PLT 206.0 05/07/2012   GLUCOSE 100* 05/07/2012   CHOL 93 05/07/2012   TRIG 23.0 05/07/2012   HDL 65.50 05/07/2012   LDLCALC 23 05/07/2012   ALT 13 05/07/2012   AST 18 05/07/2012   NA 142 05/07/2012   K 4.7 05/07/2012   CL 105 05/07/2012   CREATININE 1.1 05/07/2012   BUN 7 05/07/2012   CO2 30 05/07/2012   TSH 1.67 05/07/2012   PSA 1.17 05/07/2012   HGBA1C 6.0 05/07/2012   MICROALBUR 0.7 05/07/2012      Assessment & Plan:  HTN, with ? Of situational component DM.  well-controlled Leukopenia.   Worse, but still not at a dangerous level

## 2012-05-08 ENCOUNTER — Telehealth: Payer: Self-pay | Admitting: *Deleted

## 2012-05-08 NOTE — Telephone Encounter (Signed)
Called pt to inform of lab results, no answer/unable to leave message (letter also mailed to pt).  

## 2012-05-10 NOTE — Telephone Encounter (Signed)
Called pt to inform, no answer/unable to leave message.  

## 2012-05-13 NOTE — Telephone Encounter (Signed)
Called pt to inform of lab results, no answer/unable to leave message (mailbox full)-letter also mailed to pt's address. Closing phone note.

## 2012-06-18 ENCOUNTER — Encounter: Payer: Medicare Other | Admitting: Endocrinology

## 2012-06-18 DIAGNOSIS — Z0289 Encounter for other administrative examinations: Secondary | ICD-10-CM

## 2012-11-01 ENCOUNTER — Telehealth: Payer: Self-pay

## 2012-11-01 ENCOUNTER — Other Ambulatory Visit: Payer: Self-pay

## 2012-11-01 MED ORDER — METFORMIN HCL 1000 MG PO TABS: ORAL_TABLET | ORAL | Status: DC

## 2012-11-01 NOTE — Telephone Encounter (Signed)
Pt calling for refill of metformin, [facility] [address].

## 2012-12-02 ENCOUNTER — Other Ambulatory Visit: Payer: Self-pay | Admitting: Endocrinology

## 2013-03-04 ENCOUNTER — Other Ambulatory Visit: Payer: Self-pay | Admitting: *Deleted

## 2013-03-04 ENCOUNTER — Other Ambulatory Visit: Payer: Self-pay | Admitting: Endocrinology

## 2013-03-04 MED ORDER — SIMVASTATIN 40 MG PO TABS: 40.0000 mg | ORAL_TABLET | Freq: Every day | ORAL | Status: DC

## 2013-03-31 ENCOUNTER — Other Ambulatory Visit: Payer: Self-pay | Admitting: Endocrinology

## 2013-04-30 ENCOUNTER — Other Ambulatory Visit: Payer: Self-pay | Admitting: Endocrinology

## 2013-04-30 ENCOUNTER — Other Ambulatory Visit: Payer: Self-pay | Admitting: *Deleted

## 2013-04-30 MED ORDER — METFORMIN HCL 1000 MG PO TABS: ORAL_TABLET | ORAL | Status: DC

## 2013-05-30 ENCOUNTER — Other Ambulatory Visit: Payer: Self-pay | Admitting: *Deleted

## 2013-05-30 ENCOUNTER — Other Ambulatory Visit: Payer: Self-pay | Admitting: Endocrinology

## 2013-05-30 MED ORDER — METFORMIN HCL 1000 MG PO TABS: ORAL_TABLET | ORAL | Status: DC

## 2013-06-27 ENCOUNTER — Encounter: Payer: Self-pay | Admitting: Endocrinology

## 2013-06-27 ENCOUNTER — Ambulatory Visit (INDEPENDENT_AMBULATORY_CARE_PROVIDER_SITE_OTHER): Payer: Medicare Other | Admitting: Endocrinology

## 2013-06-27 VITALS — BP 140/82 | HR 78 | Ht 67.0 in | Wt 156.0 lb

## 2013-06-27 DIAGNOSIS — E119 Type 2 diabetes mellitus without complications: Secondary | ICD-10-CM

## 2013-06-27 DIAGNOSIS — Z125 Encounter for screening for malignant neoplasm of prostate: Secondary | ICD-10-CM

## 2013-06-27 DIAGNOSIS — E785 Hyperlipidemia, unspecified: Secondary | ICD-10-CM

## 2013-06-27 DIAGNOSIS — I10 Essential (primary) hypertension: Secondary | ICD-10-CM | POA: Diagnosis not present

## 2013-06-27 DIAGNOSIS — D72819 Decreased white blood cell count, unspecified: Secondary | ICD-10-CM | POA: Diagnosis not present

## 2013-06-27 DIAGNOSIS — D649 Anemia, unspecified: Secondary | ICD-10-CM

## 2013-06-27 DIAGNOSIS — Z79899 Other long term (current) drug therapy: Secondary | ICD-10-CM | POA: Diagnosis not present

## 2013-06-27 DIAGNOSIS — Z0000 Encounter for general adult medical examination without abnormal findings: Secondary | ICD-10-CM

## 2013-06-27 LAB — URINALYSIS, ROUTINE W REFLEX MICROSCOPIC
Bilirubin Urine: NEGATIVE
Hgb urine dipstick: NEGATIVE
Ketones, ur: NEGATIVE
Leukocytes, UA: NEGATIVE
Nitrite: NEGATIVE
Specific Gravity, Urine: 1.01 (ref 1.000–1.030)
Total Protein, Urine: NEGATIVE
Urine Glucose: NEGATIVE
Urobilinogen, UA: 0.2 (ref 0.0–1.0)
pH: 6 (ref 5.0–8.0)

## 2013-06-27 LAB — BASIC METABOLIC PANEL
BUN: 8 mg/dL (ref 6–23)
CO2: 28 mEq/L (ref 19–32)
Calcium: 9.4 mg/dL (ref 8.4–10.5)
Chloride: 104 mEq/L (ref 96–112)
Creatinine, Ser: 1 mg/dL (ref 0.4–1.5)
GFR: 98.85 mL/min (ref >60.00)
Glucose, Bld: 92 mg/dL (ref 70–99)
Potassium: 3.9 mEq/L (ref 3.5–5.1)
Sodium: 138 mEq/L (ref 135–145)

## 2013-06-27 LAB — CBC WITH DIFFERENTIAL/PLATELET
Basophils Absolute: 0 10*3/uL (ref 0.0–0.1)
Basophils Relative: 0.5 % (ref 0.0–3.0)
Eosinophils Absolute: 0 10*3/uL (ref 0.0–0.7)
Eosinophils Relative: 0.8 % (ref 0.0–5.0)
HCT: 37.2 % — ABNORMAL LOW (ref 39.0–52.0)
Hemoglobin: 12.4 g/dL — ABNORMAL LOW (ref 13.0–17.0)
Lymphocytes Relative: 32.8 % (ref 12.0–46.0)
Lymphs Abs: 1.3 10*3/uL (ref 0.7–4.0)
MCHC: 33.4 g/dL (ref 30.0–36.0)
MCV: 89.2 fl (ref 78.0–100.0)
Monocytes Absolute: 0.4 10*3/uL (ref 0.1–1.0)
Monocytes Relative: 9.8 % (ref 3.0–12.0)
Neutro Abs: 2.2 10*3/uL (ref 1.4–7.7)
Neutrophils Relative %: 56.1 % (ref 43.0–77.0)
Platelets: 196 10*3/uL (ref 150.0–400.0)
RBC: 4.17 Mil/uL — ABNORMAL LOW (ref 4.22–5.81)
RDW: 15.3 % — ABNORMAL HIGH (ref 11.5–14.6)
WBC: 4 10*3/uL — ABNORMAL LOW (ref 4.5–10.5)

## 2013-06-27 LAB — LIPID PANEL
Cholesterol: 95 mg/dL (ref 0–200)
HDL: 58 mg/dL (ref >39.00)
LDL Cholesterol: 31 mg/dL (ref 0–99)
Total CHOL/HDL Ratio: 2
Triglycerides: 28 mg/dL (ref 0.0–149.0)
VLDL: 5.6 mg/dL (ref 0.0–40.0)

## 2013-06-27 LAB — HEPATIC FUNCTION PANEL
ALT: 13 U/L (ref 0–53)
AST: 22 U/L (ref 0–37)
Albumin: 4.3 g/dL (ref 3.5–5.2)
Alkaline Phosphatase: 42 U/L (ref 39–117)
Bilirubin, Direct: 0.1 mg/dL (ref 0.0–0.3)
Total Bilirubin: 0.8 mg/dL (ref 0.3–1.2)
Total Protein: 6.9 g/dL (ref 6.0–8.3)

## 2013-06-27 LAB — MICROALBUMIN / CREATININE URINE RATIO
Creatinine,U: 68.7 mg/dL
Microalb Creat Ratio: 1 mg/g (ref 0.0–30.0)
Microalb, Ur: 0.7 mg/dL (ref 0.0–1.9)

## 2013-06-27 LAB — TSH: TSH: 1.55 u[IU]/mL (ref 0.35–5.50)

## 2013-06-27 LAB — PSA, MEDICARE: PSA: 1.28 ng/ml (ref 0.10–4.00)

## 2013-06-27 LAB — HEMOGLOBIN A1C: Hgb A1c MFr Bld: 5.9 % (ref 4.6–6.5)

## 2013-06-27 MED ORDER — VALSARTAN-HYDROCHLOROTHIAZIDE 320-25 MG PO TABS: 1.0000 | ORAL_TABLET | Freq: Every day | ORAL | Status: DC

## 2013-06-27 MED ORDER — METFORMIN HCL 1000 MG PO TABS: ORAL_TABLET | ORAL | Status: DC

## 2013-06-27 MED ORDER — SIMVASTATIN 40 MG PO TABS: 40.0000 mg | ORAL_TABLET | Freq: Every day | ORAL | Status: DC

## 2013-06-27 NOTE — Progress Notes (Signed)
Subjective:    Patient ID: Ricardo Dawson, male    DOB: Dec 22, 1945, 67 y.o.   MRN: [id number]  HPI The state of at least three ongoing medical problems is addressed today, with interval history of each noted here: HTN: pt says he does not take diovan-hct as rx'ed.  Denies sob Dyslipidemia: he denies chest pain. DM: he denies numbness. Past Medical History  Diagnosis Date  . Diabetes mellitus     Type II  . Hyperlipidemia   . Hypertension     No past surgical history on file.  History   Social History  . Marital Status: Married    Spouse Name: N/A    Number of Children: N/A  . Years of Education: N/A   Occupational History  . Not on file.   Social History Main Topics  . Smoking status: Current Some Day Smoker    Types: Cigars  . Smokeless tobacco: Not on file  . Alcohol Use: Yes     Comment: Rare  . Drug Use: No  . Sexual Activity: Not on file   Other Topics Concern  . Not on file   Social History Narrative  . No narrative on file    Current Outpatient Prescriptions on File Prior to Visit  Medication Sig Dispense Refill  . aspirin 81 MG tablet Take 81 mg by mouth daily.         No current facility-administered medications on file prior to visit.    Allergies  Allergen Reactions  . Ace Inhibitors     REACTION: cough 2002--altace    Family History  Problem Relation Age of Onset  . Cancer Neg Hx     BP 140/82  Pulse 78  Ht 5\' 7"  (1.702 m)  Wt 156 lb (70.761 kg)  BMI 24.43 kg/m2  SpO2 97%  Review of Systems Denies weight change and n/v    Objective:   Physical Exam VITAL SIGNS:  See vs page GENERAL: no distress NECK: There is no palpable thyroid enlargement.  No thyroid nodule is palpable.  No palpable lymphadenopathy at the anterior neck. LUNGS:  Clear to auscultation HEART:  Regular rate and rhythm without murmurs noted. Normal S1,S2.   Pulses: no carotid bruit  Lab Results  Component Value Date   WBC 4.0* 06/27/2013   HGB 12.4*  06/27/2013   HCT 37.2* 06/27/2013   PLT 196.0 06/27/2013   GLUCOSE 92 06/27/2013   CHOL 95 06/27/2013   TRIG 28.0 06/27/2013   HDL 58.00 06/27/2013   LDLCALC 31 06/27/2013   ALT 13 06/27/2013   AST 22 06/27/2013   NA 138 06/27/2013   K 3.9 06/27/2013   CL 104 06/27/2013   CREATININE 1.0 06/27/2013   BUN 8 06/27/2013   CO2 28 06/27/2013   TSH 1.55 06/27/2013   PSA 1.28 06/27/2013   HGBA1C 5.9 06/27/2013   MICROALBUR 0.7 06/27/2013      Assessment & Plan:  Dyslipidemia: well-controlled HTN: therapy limited by noncompliance.  i'll do the best i can. DM: well-controlled   Subjective:   Patient here for Medicare annual wellness visit and management of other chronic and acute problems.     Risk factors: advanced age    Roster of Physicians Providing Medical Care to Patient:  See "snapshot"   Activities of Daily Living: In your present state of health, do you have any difficulty performing the following activities?:  Preparing food and eating?: No  Bathing yourself: No  Getting dressed: No  Using the  toilet:No  Moving around from place to place: No  In the past year have you fallen or had a near fall?:No    Home Safety: Has smoke detector and wears seat belts. No firearms. No excess sun exposure.  Diet and Exercise  Current exercise habits: pt says good Dietary issues discussed: pt reports a healthy diet   Depression Screen  Q1: Over the past two weeks, have you felt down, depressed or hopeless? no  Q2: Over the past two weeks, have you felt little interest or pleasure in doing things? no   The following portions of the patient's history were reviewed and updated as appropriate: allergies, current medications, past family history, past medical history, past social history, past surgical history and problem list.  Past Medical History  Diagnosis Date  . Diabetes mellitus     Type II  . Hyperlipidemia   . Hypertension     No past surgical history on file.  History   Social  History  . Marital Status: Married    Spouse Name: N/A    Number of Children: N/A  . Years of Education: N/A   Occupational History  . Not on file.   Social History Main Topics  . Smoking status: Current Some Day Smoker    Types: Cigars  . Smokeless tobacco: Not on file  . Alcohol Use: Yes     Comment: Rare  . Drug Use: No  . Sexual Activity: Not on file   Other Topics Concern  . Not on file   Social History Narrative  . No narrative on file    Current Outpatient Prescriptions on File Prior to Visit  Medication Sig Dispense Refill  . aspirin 81 MG tablet Take 81 mg by mouth daily.         No current facility-administered medications on file prior to visit.    Allergies  Allergen Reactions  . Ace Inhibitors     REACTION: cough 2002--altace    Family History  Problem Relation Age of Onset  . Cancer Neg Hx     BP 140/82  Pulse 78  Ht 5\' 7"  (1.702 m)  Wt 156 lb (70.761 kg)  BMI 24.43 kg/m2  SpO2 97%   Review of Systems  Denies hearing loss, and visual loss Objective:   Vision:  Sees opthalmologist Hearing: grossly normal Body mass index:  See vs page Msk: pt easily and quickly performs "get-up-and-go" from a sitting position Cognitive Impairment Assessment: cognition, memory and judgment appear normal.  remembers 3/3 at 5 minutes.  excellent recall.  can easily read and write a sentence.  alert and oriented x 3.    Assessment:   Medicare wellness utd on preventive parameters.     Plan:   During the course of the visit the patient was educated and counseled about appropriate screening and preventive services including:        Fall prevention Diabetes screening  Nutrition counseling   Vaccines / LABS Zostavax / Pnemonccoal Vaccine  today  PSA  Patient Instructions (the written plan) was given to the patient.   we discussed code status.  pt requests full code, but would not want to be started or maintained on artificial life-support measures if  there was not a reasonable chance of recovery.

## 2013-06-27 NOTE — Patient Instructions (Addendum)
please consider these measures for your health:  minimize alcohol.  do not use tobacco products.  have a colonoscopy at least every 10 years from age 67.  keep firearms safely stored.  always use seat belts.  have working smoke alarms in your home.  see an eye doctor and dentist regularly.  never drive under the influence of alcohol or drugs (including prescription drugs).  good diet and exercise habits significanly improve the control of your diabetes.  please let me know if you wish to be referred to a dietician.  high blood sugar is very risky to your health.  you should see an eye doctor every year.  You are at higher than average risk for pneumonia and hepatitis-B.  You should be vaccinated against both.   please let me know what your wishes would be, if artificial life support measures should become necessary.  it is critically important to prevent falling down (keep floor areas well-lit, dry, and free of loose objects.  If you have a cane, walker, or wheelchair, you should use it, even for short trips around the house.  Also, try not to rush).   blood tests are being requested for you today.  We'll contact you with results.   Refer for a colonoscopy.  you will receive a phone call, about a day and time for an appointment Please come back for a follow-up appointment in 6 months.

## 2013-06-28 DIAGNOSIS — D649 Anemia, unspecified: Secondary | ICD-10-CM | POA: Insufficient documentation

## 2013-06-30 ENCOUNTER — Ambulatory Visit: Payer: Medicare Other

## 2013-06-30 DIAGNOSIS — D649 Anemia, unspecified: Secondary | ICD-10-CM

## 2013-06-30 LAB — IBC PANEL
Iron: 66 ug/dL (ref 42–165)
Saturation Ratios: 16.6 % — ABNORMAL LOW (ref 20.0–50.0)
Transferrin: 283.8 mg/dL (ref 212.0–360.0)

## 2013-07-15 ENCOUNTER — Other Ambulatory Visit: Payer: Self-pay | Admitting: Endocrinology

## 2013-07-15 MED ORDER — LOSARTAN POTASSIUM-HCTZ 100-25 MG PO TABS: 1.0000 | ORAL_TABLET | Freq: Every day | ORAL | Status: DC

## 2013-08-01 ENCOUNTER — Other Ambulatory Visit: Payer: Self-pay | Admitting: Endocrinology

## 2013-08-07 ENCOUNTER — Encounter: Payer: Self-pay | Admitting: Gastroenterology

## 2013-08-12 ENCOUNTER — Other Ambulatory Visit: Payer: Self-pay

## 2013-08-12 ENCOUNTER — Other Ambulatory Visit: Payer: Medicare Other

## 2013-08-12 LAB — HEMOCCULT SLIDES (X 3 CARDS)
Fecal Occult Blood: NEGATIVE
OCCULT 1: NEGATIVE
OCCULT 2: NEGATIVE
OCCULT 3: NEGATIVE
OCCULT 4: NEGATIVE
OCCULT 5: NEGATIVE

## 2013-11-29 ENCOUNTER — Other Ambulatory Visit: Payer: Self-pay | Admitting: Endocrinology

## 2013-12-27 ENCOUNTER — Other Ambulatory Visit: Payer: Self-pay | Admitting: Endocrinology

## 2013-12-28 ENCOUNTER — Other Ambulatory Visit: Payer: Self-pay | Admitting: Endocrinology

## 2014-03-01 ENCOUNTER — Other Ambulatory Visit: Payer: Self-pay | Admitting: Endocrinology

## 2014-04-09 ENCOUNTER — Other Ambulatory Visit: Payer: Self-pay | Admitting: Endocrinology

## 2014-04-30 ENCOUNTER — Other Ambulatory Visit: Payer: Self-pay | Admitting: Endocrinology

## 2014-04-30 MED ORDER — METFORMIN HCL 500 MG PO TABS: 500.0000 mg | ORAL_TABLET | Freq: Two times a day (BID) | ORAL | Status: DC

## 2014-04-30 MED ORDER — GLIMEPIRIDE 2 MG PO TABS: 2.0000 mg | ORAL_TABLET | Freq: Every day | ORAL | Status: DC

## 2014-05-30 ENCOUNTER — Other Ambulatory Visit: Payer: Self-pay | Admitting: Endocrinology

## 2014-06-04 ENCOUNTER — Telehealth: Payer: Self-pay

## 2014-06-04 NOTE — Telephone Encounter (Signed)
LVM for pt to call back and schedule CPE with PCP.   /Diabetic Bundle pt./ 

## 2014-06-25 ENCOUNTER — Telehealth: Payer: Self-pay

## 2014-06-25 NOTE — Telephone Encounter (Signed)
Diabetic Bundle. Requested call back schedule appointment with Dr. Everardo AllEllison.

## 2014-07-10 ENCOUNTER — Ambulatory Visit (INDEPENDENT_AMBULATORY_CARE_PROVIDER_SITE_OTHER): Payer: Medicare Other | Admitting: Endocrinology

## 2014-07-10 ENCOUNTER — Encounter: Payer: Self-pay | Admitting: Endocrinology

## 2014-07-10 VITALS — BP 160/110 | HR 86 | Temp 98.0°F | Ht 67.0 in | Wt 147.0 lb

## 2014-07-10 DIAGNOSIS — Z125 Encounter for screening for malignant neoplasm of prostate: Secondary | ICD-10-CM | POA: Diagnosis not present

## 2014-07-10 DIAGNOSIS — A699 Spirochetal infection, unspecified: Secondary | ICD-10-CM | POA: Diagnosis not present

## 2014-07-10 DIAGNOSIS — R9431 Abnormal electrocardiogram [ECG] [EKG]: Secondary | ICD-10-CM

## 2014-07-10 DIAGNOSIS — E119 Type 2 diabetes mellitus without complications: Secondary | ICD-10-CM | POA: Diagnosis not present

## 2014-07-10 DIAGNOSIS — D649 Anemia, unspecified: Secondary | ICD-10-CM

## 2014-07-10 DIAGNOSIS — Z0000 Encounter for general adult medical examination without abnormal findings: Secondary | ICD-10-CM | POA: Diagnosis not present

## 2014-07-10 DIAGNOSIS — D72819 Decreased white blood cell count, unspecified: Secondary | ICD-10-CM

## 2014-07-10 DIAGNOSIS — I10 Essential (primary) hypertension: Secondary | ICD-10-CM | POA: Diagnosis not present

## 2014-07-10 DIAGNOSIS — E785 Hyperlipidemia, unspecified: Secondary | ICD-10-CM

## 2014-07-10 DIAGNOSIS — Z79899 Other long term (current) drug therapy: Secondary | ICD-10-CM

## 2014-07-10 LAB — LIPID PANEL
Cholesterol: 95 mg/dL (ref 0–200)
HDL: 60.4 mg/dL (ref >39.00)
LDL Cholesterol: 28 mg/dL (ref 0–99)
NonHDL: 34.6
Total CHOL/HDL Ratio: 2
Triglycerides: 32 mg/dL (ref 0.0–149.0)
VLDL: 6.4 mg/dL (ref 0.0–40.0)

## 2014-07-10 LAB — IBC PANEL
Iron: 83 ug/dL (ref 42–165)
Saturation Ratios: 20.8 % (ref 20.0–50.0)
Transferrin: 285.3 mg/dL (ref 212.0–360.0)

## 2014-07-10 LAB — CBC WITH DIFFERENTIAL/PLATELET
Basophils Absolute: 0 10*3/uL (ref 0.0–0.1)
Basophils Relative: 0.7 % (ref 0.0–3.0)
Eosinophils Absolute: 0.1 10*3/uL (ref 0.0–0.7)
Eosinophils Relative: 1.3 % (ref 0.0–5.0)
HCT: 39 % (ref 39.0–52.0)
Hemoglobin: 12.9 g/dL — ABNORMAL LOW (ref 13.0–17.0)
Lymphocytes Relative: 30.6 % (ref 12.0–46.0)
Lymphs Abs: 1.3 10*3/uL (ref 0.7–4.0)
MCHC: 33 g/dL (ref 30.0–36.0)
MCV: 92.7 fl (ref 78.0–100.0)
Monocytes Absolute: 0.4 10*3/uL (ref 0.1–1.0)
Monocytes Relative: 9.5 % (ref 3.0–12.0)
Neutro Abs: 2.5 10*3/uL (ref 1.4–7.7)
Neutrophils Relative %: 57.9 % (ref 43.0–77.0)
Platelets: 220 10*3/uL (ref 150.0–400.0)
RBC: 4.21 Mil/uL — ABNORMAL LOW (ref 4.22–5.81)
RDW: 15.3 % (ref 11.5–15.5)
WBC: 4.3 10*3/uL (ref 4.0–10.5)

## 2014-07-10 LAB — PSA, MEDICARE: PSA: 1.49 ng/ml (ref 0.10–4.00)

## 2014-07-10 LAB — BASIC METABOLIC PANEL
BUN: 11 mg/dL (ref 6–23)
CO2: 26 mEq/L (ref 19–32)
Calcium: 9.8 mg/dL (ref 8.4–10.5)
Chloride: 104 mEq/L (ref 96–112)
Creatinine, Ser: 1 mg/dL (ref 0.4–1.5)
GFR: 100.94 mL/min (ref >60.00)
Glucose, Bld: 108 mg/dL — ABNORMAL HIGH (ref 70–99)
Potassium: 4.6 mEq/L (ref 3.5–5.1)
Sodium: 140 mEq/L (ref 135–145)

## 2014-07-10 LAB — URINALYSIS, ROUTINE W REFLEX MICROSCOPIC
Bilirubin Urine: NEGATIVE
Hgb urine dipstick: NEGATIVE
Ketones, ur: NEGATIVE
Leukocytes, UA: NEGATIVE
Nitrite: NEGATIVE
Specific Gravity, Urine: <=1.005 — AB (ref 1.000–1.030)
Total Protein, Urine: NEGATIVE
Urine Glucose: NEGATIVE
Urobilinogen, UA: 0.2 (ref 0.0–1.0)
pH: 5.5 (ref 5.0–8.0)

## 2014-07-10 LAB — HEPATIC FUNCTION PANEL
ALT: 12 U/L (ref 0–53)
AST: 21 U/L (ref 0–37)
Albumin: 4.4 g/dL (ref 3.5–5.2)
Alkaline Phosphatase: 52 U/L (ref 39–117)
Bilirubin, Direct: 0.1 mg/dL (ref 0.0–0.3)
Total Bilirubin: 0.7 mg/dL (ref 0.2–1.2)
Total Protein: 7.4 g/dL (ref 6.0–8.3)

## 2014-07-10 LAB — HEMOGLOBIN A1C: Hgb A1c MFr Bld: 5.8 % (ref 4.6–6.5)

## 2014-07-10 LAB — MICROALBUMIN / CREATININE URINE RATIO
Creatinine,U: 45.6 mg/dL
Microalb Creat Ratio: 0.4 mg/g (ref 0.0–30.0)
Microalb, Ur: 0.2 mg/dL (ref 0.0–1.9)

## 2014-07-10 LAB — TSH: TSH: 1.47 u[IU]/mL (ref 0.35–4.50)

## 2014-07-10 MED ORDER — CARVEDILOL 3.125 MG PO TABS: 3.1250 mg | ORAL_TABLET | Freq: Two times a day (BID) | ORAL | Status: DC

## 2014-07-10 MED ORDER — SIMVASTATIN 10 MG PO TABS: 10.0000 mg | ORAL_TABLET | Freq: Every day | ORAL | Status: DC

## 2014-07-10 NOTE — Progress Notes (Signed)
Subjective:    Patient ID: Ricardo Dawson, male    DOB: [birth year], 68 y.o.   MRN: [id number]  HPI The state of at least three ongoing medical problems is addressed today, with interval history of each noted here: HTN: Pt says he never misses BP meds.  Denies edema.   Dyslipidemia: denies chest pain. DM: he denies weight change. Past Medical History  Diagnosis Date  . Diabetes mellitus     Type II  . Hyperlipidemia   . Hypertension     No past surgical history on file.  History   Social History  . Marital Status: Married    Spouse Name: N/A    Number of Children: N/A  . Years of Education: N/A   Occupational History  . Not on file.   Social History Main Topics  . Smoking status: Current Some Day Smoker    Types: Cigars  . Smokeless tobacco: Not on file  . Alcohol Use: Yes     Comment: Rare  . Drug Use: No  . Sexual Activity: Not on file   Other Topics Concern  . Not on file   Social History Narrative  . No narrative on file    Current Outpatient Prescriptions on File Prior to Visit  Medication Sig Dispense Refill  . aspirin 81 MG tablet Take 81 mg by mouth daily.        Marland Kitchen glimepiride (AMARYL) 2 MG tablet Take 1 tablet (2 mg total) by mouth daily before breakfast.  30 tablet  1  . losartan-hydrochlorothiazide (HYZAAR) 100-25 MG per tablet Take 1 tablet by mouth daily.  90 tablet  3  . metFORMIN (GLUCOPHAGE) 1000 MG tablet TAKE 1 TABLET (1,000 MG TOTAL) BY MOUTH 2 (TWO) TIMES DAILY WITH A MEAL.  60 tablet  0  . metFORMIN (GLUCOPHAGE) 500 MG tablet Take 1 tablet (500 mg total) by mouth 2 (two) times daily with a meal.  60 tablet  1   No current facility-administered medications on file prior to visit.    Allergies  Allergen Reactions  . Ace Inhibitors     REACTION: cough 2002--altace    Family History  Problem Relation Age of Onset  . Cancer Neg Hx     BP 160/110  Pulse 86  Temp(Src) 98 F (36.7 C) (Oral)  Ht  (1.702 m)  Wt 147 lb (66.679  kg)  BMI 23.02 kg/m2  SpO2 96%   Review of Systems Denies sob and hypoglycemia.      Objective:   Physical Exam VITAL SIGNS:  See vs page GENERAL: no distress LUNGS:  Clear to auscultation HEART:  Regular rate and rhythm without murmurs noted. Normal S1,S2.   Pulses: dorsalis pedis intact bilat.   Feet: no deformity. normal color and temp.  no edema Skin:  no ulcer on the feet.   Neuro: sensation is intact to touch on the feet.     Lab Results  Component Value Date   WBC 4.3 07/10/2014   HGB 12.9* 07/10/2014   HCT 39.0 07/10/2014   PLT 220.0 07/10/2014   GLUCOSE 108* 07/10/2014   CHOL 95 07/10/2014   TRIG 32.0 07/10/2014   HDL 60.40 07/10/2014   LDLCALC 28 07/10/2014   ALT 12 07/10/2014   AST 21 07/10/2014   NA 140 07/10/2014   K 4.6 07/10/2014   CL 104 07/10/2014   CREATININE 1.0 07/10/2014   BUN 11 07/10/2014   CO2 26 07/10/2014   TSH 1.47 07/10/2014  PSA 1.49 07/10/2014   HGBA1C 5.8 07/10/2014   MICROALBUR 0.2 07/10/2014       Assessment & Plan:  HTN: moderate exacerbation:i have sent a prescription to your pharmacy, for an additional blood pressure pill. Please have the blood pressure rechecked here in 2 weeks.  Dyslipidemia: overcontrolled DM: well-controlled    Subjective:   Patient here for Medicare annual wellness visit and management of other chronic and acute problems.     Risk factors: advanced age    Roster of Physicians Providing Medical Care to Patient:  See "snapshot"   Activities of Daily Living: In your present state of health, do you have any difficulty performing the following activities?:  Preparing food and eating?: No  Bathing yourself: No  Getting dressed: No  Using the toilet:No  Moving around from place to place: No  In the past year have you fallen or had a near fall?:No    Home Safety: Has smoke detector and wears seat belts. Firearms are safely stored.   Diet and Exercise  Current exercise habits: pt says good. Dietary issues discussed:  pt reports a healthy diet   Depression Screen  Q1: Over the past two weeks, have you felt down, depressed or hopeless?no  Q2: Over the past two weeks, have you felt little interest or pleasure in doing things? no   The following portions of the patient's history were reviewed and updated as appropriate: allergies, current medications, past family history, past medical history, past social history, past surgical history and problem list.  Past Medical History  Diagnosis Date  . Diabetes mellitus     Type II  . Hyperlipidemia   . Hypertension     No past surgical history on file.  History   Social History  . Marital Status: Married    Spouse Name: N/A    Number of Children: N/A  . Years of Education: N/A   Occupational History  . Not on file.   Social History Main Topics  . Smoking status: Current Some Day Smoker    Types: Cigars  . Smokeless tobacco: Not on file  . Alcohol Use: Yes     Comment: Rare  . Drug Use: No  . Sexual Activity: Not on file   Other Topics Concern  . Not on file   Social History Narrative  . No narrative on file    Current Outpatient Prescriptions on File Prior to Visit  Medication Sig Dispense Refill  . aspirin 81 MG tablet Take 81 mg by mouth daily.        Marland Kitchen glimepiride (AMARYL) 2 MG tablet Take 1 tablet (2 mg total) by mouth daily before breakfast.  30 tablet  1  . losartan-hydrochlorothiazide (HYZAAR) 100-25 MG per tablet Take 1 tablet by mouth daily.  90 tablet  3  . metFORMIN (GLUCOPHAGE) 1000 MG tablet TAKE 1 TABLET (1,000 MG TOTAL) BY MOUTH 2 (TWO) TIMES DAILY WITH A MEAL.  60 tablet  0  . metFORMIN (GLUCOPHAGE) 500 MG tablet Take 1 tablet (500 mg total) by mouth 2 (two) times daily with a meal.  60 tablet  1  . simvastatin (ZOCOR) 40 MG tablet TAKE 1 TABLET (40 MG TOTAL) BY MOUTH AT BEDTIME.  90 tablet  0   No current facility-administered medications on file prior to visit.    Allergies  Allergen Reactions  . Ace Inhibitors      REACTION: cough 2002--altace    Family History  Problem Relation Age of Onset  .  Cancer Neg Hx     BP 160/110  Pulse 86  Temp(Src) 98 F (36.7 C) (Oral)  Ht  (1.702 m)  Wt 147 lb (66.679 kg)  BMI 23.02 kg/m2  SpO2 96%   Review of Systems  Denies hearing loss, and visual loss Objective:   Vision:  Sees opthalmologist Hearing: grossly normal Body mass index:  See vs page Msk: pt easily and quickly performs "get-up-and-go" from a sitting position Cognitive Impairment Assessment: cognition, memory and judgment appear normal.  remembers 2/3 at 5 minutes (? Effort).  excellent recall.  can easily read and write a sentence.  alert and oriented x 3   Assessment:   Medicare wellness utd on preventive parameters    Plan:   During the course of the visit the patient was educated and counseled about appropriate screening and preventive services including:        Fall prevention    Diabetes screening  Nutrition counseling   Vaccines / LABS Zostavax / Pneumococcal Vaccine  today  PSA  Patient Instructions (the written plan) was given to the patient.   we discussed code status.  pt requests full code, but would not want to be started or maintained on artificial life-support measures if there was not a reasonable chance of recovery

## 2014-07-10 NOTE — Patient Instructions (Addendum)
i have sent a prescription to your pharmacy, for an additional blood pressure pill.  Please have the blood pressure rechecked here in 2 weeks.   please consider these measures for your health:  minimize alcohol.  do not use tobacco products.  have a colonoscopy at least every 10 years from age 68.   keep firearms safely stored.  always use seat belts.  have working smoke alarms in your home.  see an eye doctor and dentist regularly.  never drive under the influence of alcohol or drugs (including prescription drugs).   it is critically important to prevent falling down (keep floor areas well-lit, dry, and free of loose objects.  If you have a cane, walker, or wheelchair, you should use it, even for short trips around the house.  Also, try not to rush). good diet and exercise habits significanly improve the control of your diabetes.  please let me know if you wish to be referred to a dietician.  high blood sugar is very risky to your health.  you should see an eye doctor and dentist every year.  You are at higher than average risk for pneumonia and hepatitis-B.  You should be vaccinated against both.   blood tests are being requested for you today.  We'll contact you with results. Please come back for a follow-up appointment in 6 months.

## 2014-07-26 ENCOUNTER — Other Ambulatory Visit: Payer: Self-pay | Admitting: Endocrinology

## 2014-08-14 ENCOUNTER — Encounter: Payer: Self-pay | Admitting: Endocrinology

## 2014-08-26 ENCOUNTER — Telehealth: Payer: Self-pay | Admitting: Endocrinology

## 2014-08-26 NOTE — Telephone Encounter (Signed)
See below, Thanks!  

## 2014-08-26 NOTE — Telephone Encounter (Signed)
Patient would like to know if he can still take the ASA while taking the new B/P medication    Please advise patient    Thank You

## 2014-09-21 ENCOUNTER — Other Ambulatory Visit: Payer: Self-pay | Admitting: Endocrinology

## 2014-10-12 ENCOUNTER — Other Ambulatory Visit: Payer: Self-pay

## 2014-10-12 MED ORDER — SIMVASTATIN 10 MG PO TABS: 10.0000 mg | ORAL_TABLET | Freq: Every day | ORAL | Status: DC

## 2014-10-26 DIAGNOSIS — H2512 Age-related nuclear cataract, left eye: Secondary | ICD-10-CM | POA: Diagnosis not present

## 2014-10-26 DIAGNOSIS — E119 Type 2 diabetes mellitus without complications: Secondary | ICD-10-CM | POA: Diagnosis not present

## 2014-11-03 ENCOUNTER — Telehealth: Payer: Self-pay

## 2014-11-03 NOTE — Telephone Encounter (Signed)
Requested call back from pt to discuss Flu Shot.

## 2014-11-11 DIAGNOSIS — H2512 Age-related nuclear cataract, left eye: Secondary | ICD-10-CM | POA: Diagnosis not present

## 2014-11-25 ENCOUNTER — Other Ambulatory Visit: Payer: Self-pay | Admitting: Endocrinology

## 2014-11-30 ENCOUNTER — Other Ambulatory Visit: Payer: Self-pay | Admitting: Ophthalmology

## 2014-11-30 DIAGNOSIS — H5340 Unspecified visual field defects: Secondary | ICD-10-CM

## 2014-12-09 ENCOUNTER — Ambulatory Visit
Admission: RE | Admit: 2014-12-09 | Discharge: 2014-12-09 | Disposition: A | Discharge status: Home / Self Care | Payer: Medicare Other | Source: Ambulatory Visit | Attending: Ophthalmology | Admitting: Ophthalmology

## 2014-12-09 DIAGNOSIS — H5340 Unspecified visual field defects: Secondary | ICD-10-CM

## 2014-12-18 LAB — HM DIABETES EYE EXAM

## 2014-12-22 ENCOUNTER — Ambulatory Visit
Admission: RE | Admit: 2014-12-22 | Discharge: 2014-12-22 | Disposition: A | Discharge status: Home / Self Care | Payer: Medicare Other | Source: Ambulatory Visit | Attending: Ophthalmology | Admitting: Ophthalmology

## 2014-12-22 MED ORDER — GADOBENATE DIMEGLUMINE 529 MG/ML IV SOLN
14.0000 mL | Freq: Once | INTRAVENOUS | Status: AC | PRN
  Administered 2014-12-22: 14 mL via INTRAVENOUS

## 2015-01-11 ENCOUNTER — Ambulatory Visit: Payer: Medicare Other | Admitting: Endocrinology

## 2015-01-13 ENCOUNTER — Encounter: Payer: Self-pay | Admitting: Endocrinology

## 2015-01-13 ENCOUNTER — Ambulatory Visit (INDEPENDENT_AMBULATORY_CARE_PROVIDER_SITE_OTHER): Payer: Medicare Other | Admitting: Endocrinology

## 2015-01-13 VITALS — BP 150/102 | HR 68 | Temp 97.9°F | Ht 67.0 in | Wt 146.0 lb

## 2015-01-13 DIAGNOSIS — E119 Type 2 diabetes mellitus without complications: Secondary | ICD-10-CM | POA: Diagnosis not present

## 2015-01-13 LAB — HEMOGLOBIN A1C: Hgb A1c MFr Bld: 5.7 % (ref 4.6–6.5)

## 2015-01-13 MED ORDER — CARVEDILOL 6.25 MG PO TABS: 6.2500 mg | ORAL_TABLET | Freq: Two times a day (BID) | ORAL | Status: DC

## 2015-01-13 MED ORDER — GLIMEPIRIDE 1 MG PO TABS: 1.0000 mg | ORAL_TABLET | Freq: Every day | ORAL | Status: DC

## 2015-01-13 NOTE — Patient Instructions (Addendum)
i have sent a prescription to your pharmacy, to double the carvedilol.  blood tests are being requested for you today.  We'll contact you with results.   Based on the results, we may need to reduce the glimepiride.   Please come back for a regular physical appointment in 2 weeks.

## 2015-01-13 NOTE — Progress Notes (Signed)
   Subjective:    Patient ID: Ricardo Dawson, male    DOB: Apr 18, 1946, 69 y.o.   MRN: [id number]  HPI Pt returns for f/u of diabetes mellitus: DM type: 2 Dx'ed: 2009 Complications: none.  Therapy: 2 oral meds.   DKA: never  Severe hypoglycemia: never Pancreatitis: never Other: pt has requested an inexpensive med regimen. Interval history: he denies hypoglycemia.  no cbg record, but states cbg's are well-controlled Pt says he never misses BP meds.  Denies chest pain.   Past Medical History  Diagnosis Date  . Diabetes mellitus     Type II  . Hyperlipidemia   . Hypertension     No past surgical history on file.  History   Social History  . Marital Status: Married    Spouse Name: N/A  . Number of Children: N/A  . Years of Education: N/A   Occupational History  . Not on file.   Social History Main Topics  . Smoking status: Current Some Day Smoker    Types: Cigars  . Smokeless tobacco: Not on file  . Alcohol Use: Yes     Comment: Rare  . Drug Use: No  . Sexual Activity: Not on file   Other Topics Concern  . Not on file   Social History Narrative    Current Outpatient Prescriptions on File Prior to Visit  Medication Sig Dispense Refill  . aspirin 81 MG tablet Take 81 mg by mouth daily.      Marland Kitchen. losartan-hydrochlorothiazide (HYZAAR) 100-25 MG per tablet Take 1 tablet by mouth daily. 90 tablet 3  . metFORMIN (GLUCOPHAGE) 500 MG tablet Take 1 tablet (500 mg total) by mouth 2 (two) times daily with a meal. 60 tablet 1  . simvastatin (ZOCOR) 10 MG tablet Take 1 tablet (10 mg total) by mouth at bedtime. 90 tablet 1   No current facility-administered medications on file prior to visit.    Allergies  Allergen Reactions  . Ace Inhibitors     REACTION: cough 2002--altace    Family History  Problem Relation Age of Onset  . Cancer Neg Hx     BP 150/102 mmHg  Pulse 68  Temp(Src) 97.9 F (36.6 C) (Oral)  Ht 5\' 7"  (1.702 m)  Wt 146 lb (66.225 kg)  BMI 22.86  kg/m2  Review of Systems He denies weight change and sob.      Objective:   Physical Exam VITAL SIGNS:  See vs page GENERAL: no distress Pulses: dorsalis pedis intact bilat.   MSK: no deformity of the feet.  CV: no leg edema.   Skin:  no ulcer on the feet.  normal color and temp on the feet. Neuro: sensation is intact to touch on the feet.    Lab Results  Component Value Date   HGBA1C 5.7 01/13/2015        Assessment & Plan:  HTN: worse DM: overcontrolled   Patient is advised the following: Patient Instructions  i have sent a prescription to your pharmacy, to double the carvedilol.  blood tests are being requested for you today.  We'll contact you with results.   Based on the results, we may need to reduce the glimepiride.   Please come back for a regular physical appointment in 2 weeks.   addendum: reduce amaryl to 1 mg qd.

## 2015-01-18 ENCOUNTER — Telehealth: Payer: Self-pay | Admitting: Endocrinology

## 2015-01-18 NOTE — Telephone Encounter (Signed)
You were found to have 2 vitamin deficiencies: Thiamine is a pill taken by mouth.  Take a thiamine pill, 1 per day. The B-12 need to be given by an injection.  Please schedule a nurse appointment for your first injection here.

## 2015-01-18 NOTE — Telephone Encounter (Signed)
Pt needs to be called back to see if Dr. Everardo AllEllison and Dr. Loreen FreudGrowth have commicated over shot for eyes / eyes sight  Call   Back @ [phone]

## 2015-01-19 NOTE — Telephone Encounter (Signed)
Left message for pt to call back  °

## 2015-01-21 ENCOUNTER — Other Ambulatory Visit: Payer: Self-pay | Admitting: Endocrinology

## 2015-01-22 ENCOUNTER — Ambulatory Visit: Payer: Medicare Other | Admitting: Endocrinology

## 2015-01-22 ENCOUNTER — Other Ambulatory Visit: Payer: Self-pay | Admitting: *Deleted

## 2015-01-22 ENCOUNTER — Ambulatory Visit (INDEPENDENT_AMBULATORY_CARE_PROVIDER_SITE_OTHER): Payer: Medicare Other | Admitting: Endocrinology

## 2015-01-22 VITALS — BP 160/110 | HR 72 | Temp 98.1°F | Ht 69.25 in | Wt 145.6 lb

## 2015-01-22 DIAGNOSIS — Z0000 Encounter for general adult medical examination without abnormal findings: Secondary | ICD-10-CM

## 2015-01-22 DIAGNOSIS — E538 Deficiency of other specified B group vitamins: Secondary | ICD-10-CM | POA: Diagnosis not present

## 2015-01-22 DIAGNOSIS — Z119 Encounter for screening for infectious and parasitic diseases, unspecified: Secondary | ICD-10-CM | POA: Insufficient documentation

## 2015-01-22 DIAGNOSIS — Z23 Encounter for immunization: Secondary | ICD-10-CM

## 2015-01-22 MED ORDER — LOSARTAN POTASSIUM-HCTZ 100-25 MG PO TABS: 1.0000 | ORAL_TABLET | Freq: Every day | ORAL | Status: DC

## 2015-01-22 MED ORDER — CYANOCOBALAMIN 1000 MCG/ML IJ SOLN
1000.0000 ug | Freq: Once | INTRAMUSCULAR | Status: AC
  Administered 2015-01-22: 1000 ug via INTRAMUSCULAR

## 2015-01-22 MED ORDER — METFORMIN HCL 500 MG PO TABS: 500.0000 mg | ORAL_TABLET | Freq: Two times a day (BID) | ORAL | Status: DC

## 2015-01-22 NOTE — Patient Instructions (Addendum)
i have sent a prescription to your pharmacy, to resume the losartan-HCTZ. please consider these measures for your health:  minimize alcohol.  do not use tobacco products.  have a colonoscopy at least every 10 years from age 69.  keep firearms safely stored.  always use seat belts.  have working smoke alarms in your home.  see an eye doctor and dentist regularly.  never drive under the influence of alcohol or drugs (including prescription drugs).   it is critically important to prevent falling down (keep floor areas well-lit, dry, and free of loose objects.  If you have a cane, walker, or wheelchair, you should use it, even for short trips around the house.  Also, try not to rush).   Please start taking thiamine, 250 mg daily.   Please come back for a follow-up appointment in 2 months.   Please also start scheduling  B-12 injection once per month.

## 2015-01-22 NOTE — Progress Notes (Signed)
Pre visit review using our clinic review tool, if applicable. No additional management support is needed unless otherwise documented below in the visit note. 

## 2015-01-22 NOTE — Progress Notes (Signed)
we discussed code status.  pt requests full code, but would not want to be started or maintained on artificial life-support measures if there was not a reasonable chance of recovery 

## 2015-01-22 NOTE — Progress Notes (Signed)
Subjective:    Patient ID: Ricardo Dawson, male    DOB: 1946/08/15, 69 y.o.   MRN: [id number]  HPI Pt is here for regular wellness examination, and is feeling pretty well in general, and says chronic med probs are stable, except as noted below Past Medical History  Diagnosis Date  . Diabetes mellitus     Type II  . Hyperlipidemia   . Hypertension     No past surgical history on file.  History   Social History  . Marital Status: Married    Spouse Name: N/A  . Number of Children: N/A  . Years of Education: N/A   Occupational History  . Not on file.   Social History Main Topics  . Smoking status: Current Some Day Smoker    Types: Cigars  . Smokeless tobacco: Not on file  . Alcohol Use: Yes     Comment: Rare  . Drug Use: No  . Sexual Activity: Not on file   Other Topics Concern  . Not on file   Social History Narrative    Current Outpatient Prescriptions on File Prior to Visit  Medication Sig Dispense Refill  . aspirin 81 MG tablet Take 81 mg by mouth daily.      . carvedilol (COREG) 6.25 MG tablet Take 1 tablet (6.25 mg total) by mouth 2 (two) times daily with a meal. 60 tablet 11  . cyanocobalamin (,VITAMIN B-12,) 1000 MCG/ML injection Inject 1,000 mcg into the muscle every 30 (thirty) days.    Marland Kitchen. glimepiride (AMARYL) 1 MG tablet Take 1 tablet (1 mg total) by mouth daily with breakfast. 30 tablet 11  . simvastatin (ZOCOR) 10 MG tablet Take 1 tablet (10 mg total) by mouth at bedtime. 90 tablet 1   No current facility-administered medications on file prior to visit.    Allergies  Allergen Reactions  . Ace Inhibitors     REACTION: cough 2002--altace    Family History  Problem Relation Age of Onset  . Cancer Neg Hx     BP 160/110 mmHg  Pulse 72  Temp(Src) 98.1 F (36.7 C) (Oral)  Ht 5' 9.25" (1.759 m)  Wt 145 lb 9.6 oz (66.044 kg)  BMI 21.35 kg/m2  Review of Systems  Constitutional: Negative for fever.  HENT: Negative for hearing loss.   Eyes:  Negative for visual disturbance.  Respiratory: Negative for shortness of breath.   Cardiovascular: Negative for chest pain.  Gastrointestinal: Negative for anal bleeding.  Endocrine: Negative for cold intolerance.  Genitourinary: Negative for hematuria.  Musculoskeletal: Negative for back pain.  Skin: Negative for rash.  Allergic/Immunologic: Negative for environmental allergies.  Neurological: Negative for numbness.  Hematological: Does not bruise/bleed easily.  Psychiatric/Behavioral: Negative for dysphoric mood.       Objective:   Physical Exam VS: see vs page GEN: no distress HEAD: head: no deformity eyes: no periorbital swelling, no proptosis external nose and ears are normal mouth: no lesion seen NECK: supple, thyroid is not enlarged CHEST WALL: no deformity LUNGS: clear to auscultation BREASTS:  No gynecomastia CV: reg rate and rhythm, no murmur ABD: abdomen is soft, nontender.  no hepatosplenomegaly.  not distended.  no hernia GENITALIA:  Normal male.   RECTAL: normal external and internal exam.  heme neg. PROSTATE:  Normal size.  No nodule MUSCULOSKELETAL: muscle bulk and strength are grossly normal.  no obvious joint swelling.  gait is normal and steady EXTEMITIES: no deformity.  no ulcer on the feet.  feet are  of normal color and temp.  no edema PULSES: dorsalis pedis intact bilat.  no carotid bruit NEURO:  cn 2-12 grossly intact.   readily moves all 4's.  sensation is intact to touch on the feet SKIN:  Normal texture and temperature.  No rash or suspicious lesion is visible.   NODES:  None palpable at the neck PSYCH: alert, well-oriented.  Does not appear anxious nor depressed.       Assessment & Plan:  Wellness visit today, with problems stable, except as noted.   SEPARATE EVALUATION FOLLOWS--EACH PROBLEM HERE IS NEW, NOT RESPONDING TO TREATMENT, OR POSES SIGNIFICANT RISK TO THE PATIENT'S HEALTH: HISTORY OF THE PRESENT ILLNESS:  Pt returns for f/u of  diabetes mellitus: DM type: 2 Dx'ed: 2009 Complications: none.  Therapy: 2 oral meds.   DKA: never  Severe hypoglycemia: never Pancreatitis: never Other: pt has requested an inexpensive med regimen. Interval history: he denies hypoglycemia.  no cbg record, but states cbg's are well-controlled Pt says he does not take the hyzaar.   PAST MEDICAL HISTORY reviewed and up to date today REVIEW OF SYSTEMS: He denies hypoglycemia and weight change PHYSICAL EXAMINATION: VITAL SIGNS:  See vs page GENERAL: no distress Pulses: dorsalis pedis intact bilat.   MSK: no deformity of the feet CV: no leg edema Skin:  no ulcer on the feet.  normal color and temp on the feet. Neuro: sensation is intact to touch on the feet. LAB/XRAY RESULTS: Lab Results  Component Value Date   HGBA1C 5.7 01/13/2015  IMPRESSION: HTN: worse: therapy limited by noncompliance.  i'll do the best i can. DM: overcontrolled PLAN:  Reduce amaryl  to 1 mg daily Resume hyzaar

## 2015-01-27 ENCOUNTER — Encounter: Payer: Medicare Other | Admitting: Endocrinology

## 2015-02-26 ENCOUNTER — Other Ambulatory Visit: Payer: Self-pay | Admitting: Endocrinology

## 2015-03-22 DIAGNOSIS — H53413 Scotoma involving central area, bilateral: Secondary | ICD-10-CM | POA: Insufficient documentation

## 2015-03-24 ENCOUNTER — Ambulatory Visit: Payer: Medicare Other | Admitting: Endocrinology

## 2015-03-24 ENCOUNTER — Ambulatory Visit (INDEPENDENT_AMBULATORY_CARE_PROVIDER_SITE_OTHER): Payer: Medicare Other

## 2015-03-24 DIAGNOSIS — E538 Deficiency of other specified B group vitamins: Secondary | ICD-10-CM | POA: Diagnosis not present

## 2015-03-24 DIAGNOSIS — Z0289 Encounter for other administrative examinations: Secondary | ICD-10-CM

## 2015-03-24 MED ORDER — CYANOCOBALAMIN 1000 MCG/ML IJ SOLN
1000.0000 ug | Freq: Once | INTRAMUSCULAR | Status: AC
  Administered 2015-03-24: 1000 ug via INTRAMUSCULAR

## 2015-03-25 ENCOUNTER — Encounter: Payer: Self-pay | Admitting: Internal Medicine

## 2015-03-31 ENCOUNTER — Other Ambulatory Visit: Payer: Self-pay | Admitting: Endocrinology

## 2015-04-03 DIAGNOSIS — H43393 Other vitreous opacities, bilateral: Secondary | ICD-10-CM | POA: Diagnosis not present

## 2015-04-29 ENCOUNTER — Other Ambulatory Visit: Payer: Self-pay | Admitting: Endocrinology

## 2015-05-27 DIAGNOSIS — H4720 Unspecified optic atrophy: Secondary | ICD-10-CM | POA: Diagnosis not present

## 2015-05-27 DIAGNOSIS — H2511 Age-related nuclear cataract, right eye: Secondary | ICD-10-CM | POA: Diagnosis not present

## 2015-05-27 DIAGNOSIS — Z961 Presence of intraocular lens: Secondary | ICD-10-CM | POA: Diagnosis not present

## 2015-05-27 DIAGNOSIS — H463 Toxic optic neuropathy: Secondary | ICD-10-CM | POA: Diagnosis not present

## 2015-06-28 DIAGNOSIS — H3589 Other specified retinal disorders: Secondary | ICD-10-CM | POA: Diagnosis not present

## 2015-06-28 DIAGNOSIS — H53413 Scotoma involving central area, bilateral: Secondary | ICD-10-CM | POA: Diagnosis not present

## 2015-06-28 DIAGNOSIS — E119 Type 2 diabetes mellitus without complications: Secondary | ICD-10-CM | POA: Diagnosis not present

## 2015-06-28 DIAGNOSIS — H5340 Unspecified visual field defects: Secondary | ICD-10-CM | POA: Diagnosis not present

## 2015-06-28 DIAGNOSIS — H462 Nutritional optic neuropathy: Secondary | ICD-10-CM | POA: Diagnosis not present

## 2015-06-28 DIAGNOSIS — I10 Essential (primary) hypertension: Secondary | ICD-10-CM | POA: Diagnosis not present

## 2015-06-28 DIAGNOSIS — H47293 Other optic atrophy, bilateral: Secondary | ICD-10-CM | POA: Diagnosis not present

## 2015-07-01 ENCOUNTER — Other Ambulatory Visit: Payer: Self-pay | Admitting: Endocrinology

## 2015-07-02 ENCOUNTER — Other Ambulatory Visit: Payer: Self-pay

## 2015-07-12 ENCOUNTER — Other Ambulatory Visit: Payer: Self-pay

## 2015-07-12 MED ORDER — SIMVASTATIN 10 MG PO TABS
10.0000 mg | ORAL_TABLET | Freq: Every day | ORAL | Status: DC
Start: 2015-07-12 — End: 2016-01-07

## 2015-07-28 ENCOUNTER — Other Ambulatory Visit: Payer: Self-pay

## 2015-07-28 MED ORDER — METFORMIN HCL 500 MG PO TABS: 500.0000 mg | ORAL_TABLET | Freq: Two times a day (BID) | ORAL | Status: DC

## 2015-09-10 DIAGNOSIS — H4720 Unspecified optic atrophy: Secondary | ICD-10-CM | POA: Diagnosis not present

## 2015-09-10 DIAGNOSIS — H2511 Age-related nuclear cataract, right eye: Secondary | ICD-10-CM | POA: Diagnosis not present

## 2015-09-10 DIAGNOSIS — H463 Toxic optic neuropathy: Secondary | ICD-10-CM | POA: Diagnosis not present

## 2015-09-10 DIAGNOSIS — Z961 Presence of intraocular lens: Secondary | ICD-10-CM | POA: Diagnosis not present

## 2015-09-26 ENCOUNTER — Other Ambulatory Visit: Payer: Self-pay | Admitting: Endocrinology

## 2015-10-06 ENCOUNTER — Ambulatory Visit (INDEPENDENT_AMBULATORY_CARE_PROVIDER_SITE_OTHER): Payer: Medicare Other | Admitting: *Deleted

## 2015-10-06 DIAGNOSIS — Z23 Encounter for immunization: Secondary | ICD-10-CM | POA: Diagnosis not present

## 2015-10-26 ENCOUNTER — Other Ambulatory Visit: Payer: Self-pay

## 2015-10-26 MED ORDER — LOSARTAN POTASSIUM-HCTZ 100-25 MG PO TABS: 1.0000 | ORAL_TABLET | Freq: Every day | ORAL | Status: DC

## 2015-10-27 ENCOUNTER — Other Ambulatory Visit: Payer: Self-pay | Admitting: Endocrinology

## 2015-11-27 ENCOUNTER — Other Ambulatory Visit: Payer: Self-pay | Admitting: Endocrinology

## 2015-12-08 ENCOUNTER — Other Ambulatory Visit: Payer: Self-pay | Admitting: Endocrinology

## 2015-12-28 ENCOUNTER — Telehealth: Payer: Self-pay | Admitting: Endocrinology

## 2015-12-28 NOTE — Telephone Encounter (Signed)
[employer] called asking about some tests that were needing to be done. [phone] Melanee Spry

## 2015-12-29 NOTE — Telephone Encounter (Addendum)
I contacted Melanee Spry with [facility] and advised the pt is coming on 01/24/2016 for his yearly physicial and lab tests will be completed at the appointment. He voiced understanding.

## 2016-01-07 ENCOUNTER — Other Ambulatory Visit: Payer: Self-pay | Admitting: Endocrinology

## 2016-01-24 ENCOUNTER — Other Ambulatory Visit: Payer: Self-pay | Admitting: *Deleted

## 2016-01-24 ENCOUNTER — Encounter: Payer: Self-pay | Admitting: Endocrinology

## 2016-01-24 ENCOUNTER — Ambulatory Visit (INDEPENDENT_AMBULATORY_CARE_PROVIDER_SITE_OTHER): Payer: Medicare Other | Admitting: Endocrinology

## 2016-01-24 VITALS — BP 118/76 | HR 83 | Temp 97.6°F | Resp 12 | Ht 68.0 in | Wt 160.0 lb

## 2016-01-24 DIAGNOSIS — D649 Anemia, unspecified: Secondary | ICD-10-CM | POA: Diagnosis not present

## 2016-01-24 DIAGNOSIS — E119 Type 2 diabetes mellitus without complications: Secondary | ICD-10-CM | POA: Diagnosis not present

## 2016-01-24 DIAGNOSIS — Z125 Encounter for screening for malignant neoplasm of prostate: Secondary | ICD-10-CM

## 2016-01-24 DIAGNOSIS — F17200 Nicotine dependence, unspecified, uncomplicated: Secondary | ICD-10-CM

## 2016-01-24 DIAGNOSIS — Z0000 Encounter for general adult medical examination without abnormal findings: Secondary | ICD-10-CM

## 2016-01-24 DIAGNOSIS — Z720 Tobacco use: Secondary | ICD-10-CM

## 2016-01-24 DIAGNOSIS — D72819 Decreased white blood cell count, unspecified: Secondary | ICD-10-CM

## 2016-01-24 LAB — CBC WITH DIFFERENTIAL/PLATELET
Basophils Absolute: 0 10*3/uL (ref 0.0–0.1)
Basophils Relative: 0.4 % (ref 0.0–3.0)
Eosinophils Absolute: 0.1 10*3/uL (ref 0.0–0.7)
Eosinophils Relative: 1.6 % (ref 0.0–5.0)
HCT: 38.5 % — ABNORMAL LOW (ref 39.0–52.0)
Hemoglobin: 13.2 g/dL (ref 13.0–17.0)
Lymphocytes Relative: 29.9 % (ref 12.0–46.0)
Lymphs Abs: 1.2 10*3/uL (ref 0.7–4.0)
MCHC: 34.2 g/dL (ref 30.0–36.0)
MCV: 86.6 fl (ref 78.0–100.0)
Monocytes Absolute: 0.4 10*3/uL (ref 0.1–1.0)
Monocytes Relative: 10 % (ref 3.0–12.0)
Neutro Abs: 2.3 10*3/uL (ref 1.4–7.7)
Neutrophils Relative %: 58.1 % (ref 43.0–77.0)
Platelets: 212 10*3/uL (ref 150.0–400.0)
RBC: 4.45 Mil/uL (ref 4.22–5.81)
RDW: 14.6 % (ref 11.5–15.5)
WBC: 3.9 10*3/uL — ABNORMAL LOW (ref 4.0–10.5)

## 2016-01-24 LAB — URINALYSIS, ROUTINE W REFLEX MICROSCOPIC
Bilirubin Urine: NEGATIVE
Hgb urine dipstick: NEGATIVE
Ketones, ur: NEGATIVE
Leukocytes, UA: NEGATIVE
Nitrite: NEGATIVE
Specific Gravity, Urine: <=1.005 — AB (ref 1.000–1.030)
Total Protein, Urine: NEGATIVE
Urine Glucose: 100 — AB
Urobilinogen, UA: 0.2 (ref 0.0–1.0)
WBC, UA: NONE SEEN — AB (ref 0–?)
pH: 6 (ref 5.0–8.0)

## 2016-01-24 LAB — HEPATIC FUNCTION PANEL
ALT: 26 U/L (ref 0–53)
AST: 28 U/L (ref 0–37)
Albumin: 4.6 g/dL (ref 3.5–5.2)
Alkaline Phosphatase: 48 U/L (ref 39–117)
Bilirubin, Direct: 0.1 mg/dL (ref 0.0–0.3)
Total Bilirubin: 0.6 mg/dL (ref 0.2–1.2)
Total Protein: 7 g/dL (ref 6.0–8.3)

## 2016-01-24 LAB — MICROALBUMIN / CREATININE URINE RATIO
Creatinine,U: 65.9 mg/dL
Microalb Creat Ratio: 1.1 mg/g (ref 0.0–30.0)
Microalb, Ur: <0.7 mg/dL (ref 0.0–1.9)

## 2016-01-24 LAB — BASIC METABOLIC PANEL
BUN: 12 mg/dL (ref 6–23)
CO2: 28 mEq/L (ref 19–32)
Calcium: 10 mg/dL (ref 8.4–10.5)
Chloride: 101 mEq/L (ref 96–112)
Creatinine, Ser: 1.21 mg/dL (ref 0.40–1.50)
GFR: 76.02 mL/min (ref >60.00)
Glucose, Bld: 143 mg/dL — ABNORMAL HIGH (ref 70–99)
Potassium: 4.7 mEq/L (ref 3.5–5.1)
Sodium: 137 mEq/L (ref 135–145)

## 2016-01-24 LAB — LIPID PANEL
Cholesterol: 121 mg/dL (ref 0–200)
HDL: 73.5 mg/dL (ref >39.00)
LDL Cholesterol: 39 mg/dL (ref 0–99)
NonHDL: 47.88
Total CHOL/HDL Ratio: 2
Triglycerides: 43 mg/dL (ref 0.0–149.0)
VLDL: 8.6 mg/dL (ref 0.0–40.0)

## 2016-01-24 LAB — IBC PANEL
Iron: 102 ug/dL (ref 42–165)
Saturation Ratios: 25.8 % (ref 20.0–50.0)
Transferrin: 282 mg/dL (ref 212.0–360.0)

## 2016-01-24 LAB — HEMOGLOBIN A1C: Hgb A1c MFr Bld: 5.9 % (ref 4.6–6.5)

## 2016-01-24 LAB — PSA, MEDICARE: PSA: 1.96 ng/ml (ref 0.10–4.00)

## 2016-01-24 LAB — TSH: TSH: 1.28 u[IU]/mL (ref 0.35–4.50)

## 2016-01-24 NOTE — Progress Notes (Signed)
Subjective:    Patient ID: Ricardo Dawson, male    DOB: 01/16/1946, 70 y.o.   MRN: [id number]  HPI  The state of at least three ongoing medical problems is addressed today, with interval history of each noted here: Pt returns for f/u of diabetes mellitus: DM type: 2 Dx'ed: 2009 Complications: none.  Therapy: 2 oral meds.   DKA: never  Severe hypoglycemia: never Pancreatitis: never Other: pt has requested an inexpensive med regimen. Interval history: he denies hypoglycemia.  no cbg record, but states cbg's are well-controlled.  HTN: pt says he takes the hyzaar as rx'ed.  Denies sob. Dyslipidemia: he denies chest pain. Past Medical History  Diagnosis Date  . Diabetes mellitus     Type II  . Hyperlipidemia   . Hypertension     No past surgical history on file.  Social History   Social History  . Marital Status: Married    Spouse Name: N/A  . Number of Children: N/A  . Years of Education: N/A   Occupational History  . Not on file.   Social History Main Topics  . Smoking status: Current Some Day Smoker    Types: Cigars  . Smokeless tobacco: Not on file  . Alcohol Use: Yes     Comment: Rare  . Drug Use: No  . Sexual Activity: Not on file   Other Topics Concern  . Not on file   Social History Narrative    Current Outpatient Prescriptions on File Prior to Visit  Medication Sig Dispense Refill  . aspirin 81 MG tablet Take 81 mg by mouth daily.      . carvedilol (COREG) 6.25 MG tablet Take 1 tablet (6.25 mg total) by mouth 2 (two) times daily with a meal. 60 tablet 11  . cyanocobalamin (,VITAMIN B-12,) 1000 MCG/ML injection Inject 1,000 mcg into the muscle every 30 (thirty) days.    Marland Kitchen glimepiride (AMARYL) 1 MG tablet TAKE 1 TABLET (1 MG TOTAL) BY MOUTH DAILY WITH BREAKFAST. 30 tablet 10  . losartan-hydrochlorothiazide (HYZAAR) 100-25 MG tablet Take 1 tablet by mouth daily. 90 tablet 1  . metFORMIN (GLUCOPHAGE) 500 MG tablet TAKE 1 TABLET (500 MG TOTAL) BY MOUTH  2 (TWO) TIMES DAILY WITH A MEAL. 60 tablet 0  . simvastatin (ZOCOR) 10 MG tablet TAKE 1 TABLET (10 MG TOTAL) BY MOUTH AT BEDTIME. 90 tablet 0  . thiamine 250 MG tablet Take 250 mg by mouth daily.     No current facility-administered medications on file prior to visit.    Allergies  Allergen Reactions  . Ace Inhibitors     REACTION: cough 2002--altace    Family History  Problem Relation Age of Onset  . Cancer Neg Hx     BP 118/76 mmHg  Pulse 83  Temp(Src) 97.6 F (36.4 C) (Oral)  Resp 12  Ht  (1.727 m)  Wt 160 lb (72.576 kg)  BMI 24.33 kg/m2  SpO2 94%  Review of Systems Denies fever and weight change    Objective:   Physical Exam VS: see vs page GEN: no distress HEAD: head: no deformity eyes: no periorbital swelling, no proptosis external nose and ears are normal mouth: no lesion seen NECK: supple, thyroid is not enlarged CHEST WALL: no deformity LUNGS: clear to auscultation BREASTS:  No gynecomastia CV: reg rate and rhythm, no murmur.  MUSCULOSKELETAL: muscle bulk and strength are grossly normal.  no obvious joint swelling.  gait is normal and steady EXTEMITIES: no deformity.  no ulcer on the feet.  feet are of normal color and temp.  no edema PULSES: dorsalis pedis intact bilat.  no carotid bruit NEURO:  cn 2-12 grossly intact.   readily moves all 4's.  sensation is intact to touch on the feet SKIN:  Normal texture and temperature.  No rash or suspicious lesion is visible.   NODES:  None palpable at the neck PSYCH: alert, well-oriented.  Does not appear anxious nor depressed.  Lab Results  Component Value Date   WBC 3.9* 01/24/2016   HGB 13.2 01/24/2016   HCT 38.5* 01/24/2016   PLT 212.0 01/24/2016   GLUCOSE 143* 01/24/2016   CHOL 121 01/24/2016   TRIG 43.0 01/24/2016   HDL 73.50 01/24/2016   LDLCALC 39 01/24/2016   ALT 26 01/24/2016   AST 28 01/24/2016   NA 137 01/24/2016   K 4.7 01/24/2016   CL 101 01/24/2016   CREATININE 1.21 01/24/2016    BUN 12 01/24/2016   CO2 28 01/24/2016   TSH 1.28 01/24/2016   PSA 1.96 01/24/2016   HGBA1C 5.9 01/24/2016   MICROALBUR <0.7 01/24/2016       Assessment & Plan:  DM: well-controlled.  Please continue the same medications Dyslipidemia: well-controlled.  Please continue the same medication HTN: well-controlled.  Please continue the same medication Smoker: persistent: check screening CT chest   Subjective:   Patient here for Medicare annual wellness visit and management of other chronic and acute problems.     Risk factors: advanced age    Roster of Physicians Providing Medical Care to Patient:  See "snapshot"   Activities of Daily Living: In your present state of health, do you have any difficulty performing the following activities (lives with wife)?:  Preparing food and eating?: No  Bathing yourself: No  Getting dressed: No  Using the toilet:No  Moving around from place to place: No  In the past year have you fallen or had a near fall?:No    Home Safety: Has smoke detector and wears seat belts. Firearms are safely stored.  Diet and Exercise  Current exercise habits: pt says good Dietary issues discussed: pt reports a healthy diet.  Depression Screen  Q1: Over the past two weeks, have you felt down, depressed or hopeless?no  Q2: Over the past two weeks, have you felt little interest or pleasure in doing things? no   The following portions of the patient's history were reviewed and updated as appropriate: allergies, current medications, past family history, past medical history, past social history, past surgical history and problem list.   Review of Systems  Denies hearing loss, and visual loss. Objective:   Vision:  Sees opthalmologist Hearing: grossly normal Body mass index:  See vs page Msk: pt easily and quickly performs "get-up-and-go" from a sitting position Cognitive Impairment Assessment: cognition, memory and judgment appear normal.  remembers 3/3 at 5  minutes.  excellent recall.  can easily read and write a sentence.  alert and oriented x 3   Assessment:   Medicare wellness utd on preventive parameters    Plan:   During the course of the visit the patient was educated and counseled about appropriate screening and preventive services including:       Fall prevention   Diabetes screening  Nutrition counseling   Vaccines / LABS Zostavax / Pneumococcal Vaccine  today  PSA  Patient Instructions (the written plan) was given to the patient.

## 2016-01-24 NOTE — Progress Notes (Signed)
we discussed code status.  pt requests full code, but would not want to be started or maintained on artificial life-support measures if there was not a reasonable chance of recovery 

## 2016-01-24 NOTE — Patient Instructions (Addendum)
please consider these measures for your health:  minimize alcohol.  do not use tobacco products.  have a colonoscopy at least every 10 years from age 70.  keep firearms safely stored.  always use seat belts.  have working smoke alarms in your home.  see an eye doctor and dentist regularly.  never drive under the influence of alcohol or drugs (including prescription drugs).  those with fair skin should take precautions against the sun. it is critically important to prevent falling down (keep floor areas well-lit, dry, and free of loose objects.  If you have a cane, walker, or wheelchair, you should use it, even for short trips around the house.  Wear flat-soled shoes.  Also, try not to rush) blood tests are requested for you today.  We'll let you know about the results.  Let's check a CT scan.  you will receive a phone call, about a day and time for an appointment.  Please come back for a regular physical appointment in 6 months.

## 2016-02-07 ENCOUNTER — Other Ambulatory Visit: Payer: Self-pay | Admitting: Acute Care

## 2016-02-09 ENCOUNTER — Other Ambulatory Visit: Payer: Self-pay | Admitting: Endocrinology

## 2016-02-09 DIAGNOSIS — F17200 Nicotine dependence, unspecified, uncomplicated: Secondary | ICD-10-CM

## 2016-03-09 ENCOUNTER — Telehealth: Payer: Self-pay | Admitting: Acute Care

## 2016-03-09 NOTE — Telephone Encounter (Signed)
Called and spoke with pt. Per Lung Cancer Screening Protocol pt must be a 30 pack year smoker and pt states he started smoking 1.5 cigars within the last 3 years. I explained to the patient that he did not qualify for the program and that I would send notification to Dr. George HughEllison's office. He voiced understanding and had no further questions. Message has been forward to Dr. George HughEllison's office. Nothing further needed.

## 2016-03-10 DIAGNOSIS — Z961 Presence of intraocular lens: Secondary | ICD-10-CM | POA: Diagnosis not present

## 2016-03-10 DIAGNOSIS — H463 Toxic optic neuropathy: Secondary | ICD-10-CM | POA: Diagnosis not present

## 2016-03-10 DIAGNOSIS — H2511 Age-related nuclear cataract, right eye: Secondary | ICD-10-CM | POA: Diagnosis not present

## 2016-03-10 DIAGNOSIS — H4720 Unspecified optic atrophy: Secondary | ICD-10-CM | POA: Diagnosis not present

## 2016-03-22 ENCOUNTER — Other Ambulatory Visit: Payer: Self-pay | Admitting: Endocrinology

## 2016-05-23 ENCOUNTER — Other Ambulatory Visit: Payer: Self-pay | Admitting: Endocrinology

## 2016-06-07 IMAGING — MR MR ORBITS WO/W CM
13 of 19 series · 33 of 48 positions shown · IV contrast (14ml Multihance)
Comparison: None.

CLINICAL DATA: Visual field defects. Left eye cataract surgery in
[DATE]. Horizontal line in left visual field 2-3 weeks after
surgery.

BUN and creatinine were obtained on site at [HOSPITAL] at
[HOSPITAL].
Results:  BUN six mg/dL,  Creatinine 1.1 mg/dL.
EXAM:
MRI HEAD AND ORBITS WITHOUT AND WITH CONTRAST
TECHNIQUE: Multiplanar, multiecho pulse sequences of the brain and surrounding
structures were obtained without and with intravenous contrast.
Multiplanar, multiecho pulse sequences of the orbits and surrounding
structures were obtained including fat saturation techniques, before
and after intravenous contrast administration.
CONTRAST:  14mL MULTIHANCE GADOBENATE DIMEGLUMINE 529 MG/ML IV SOLN

[Series 2: T1 · sagittal · 5.0mm · 0.45mm/px · 2 of 19 slices shown]
[im 1/19]
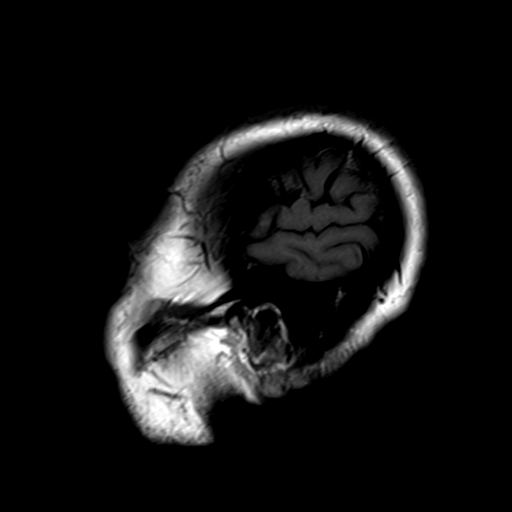
[im 19/19]
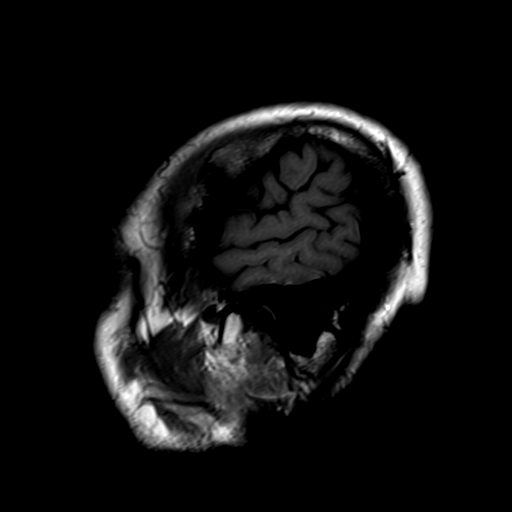

[Series 3: ep2d_diff_(id)_trace · axial · 3.0mm · 1.80mm/px · z∈[-55,+92]mm · 6 of 98 slices shown]
[im 1/98]
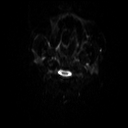
[im 20/98]
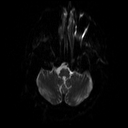
[im 39/98]
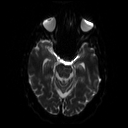
[im 59/98]
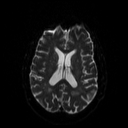
[im 78/98]
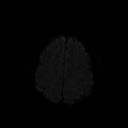
[im 98/98]
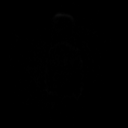

[Series 4: ep2d_diff_(id)_trace_adc · axial · 3.0mm · 1.80mm/px · z∈[-55,+92]mm · 3 of 50 slices shown]
[im 1/50]
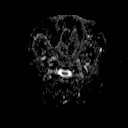
[im 25/50]
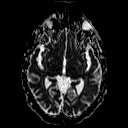
[im 50/50]
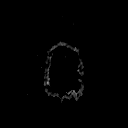

[Series 5: ep2d_diff cor for · coronal · 5.0mm · 0.90mm/px · 4 of 68 slices shown (1 of 2)]
[im 1/68]
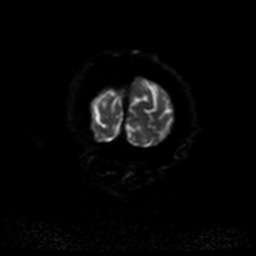
[im 23/68]
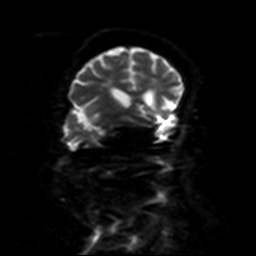
[im 45/68]
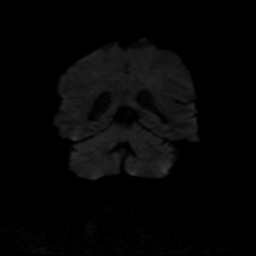
[im 68/68]
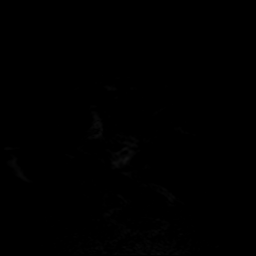

[Series 6: ep2d_diff cor for · coronal · 5.0mm · 0.90mm/px · 2 of 34 slices shown (2 of 2)]
[im 1/34]
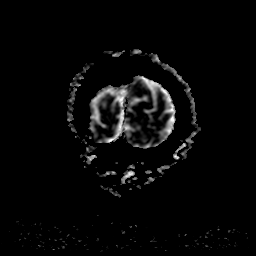
[im 34/34]
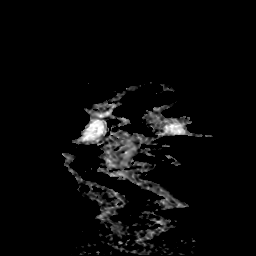

[Series 7: T2 · axial · 5.0mm · 0.30mm/px · 1 of 22 slices shown (1 of 2)]
[im 1/22]
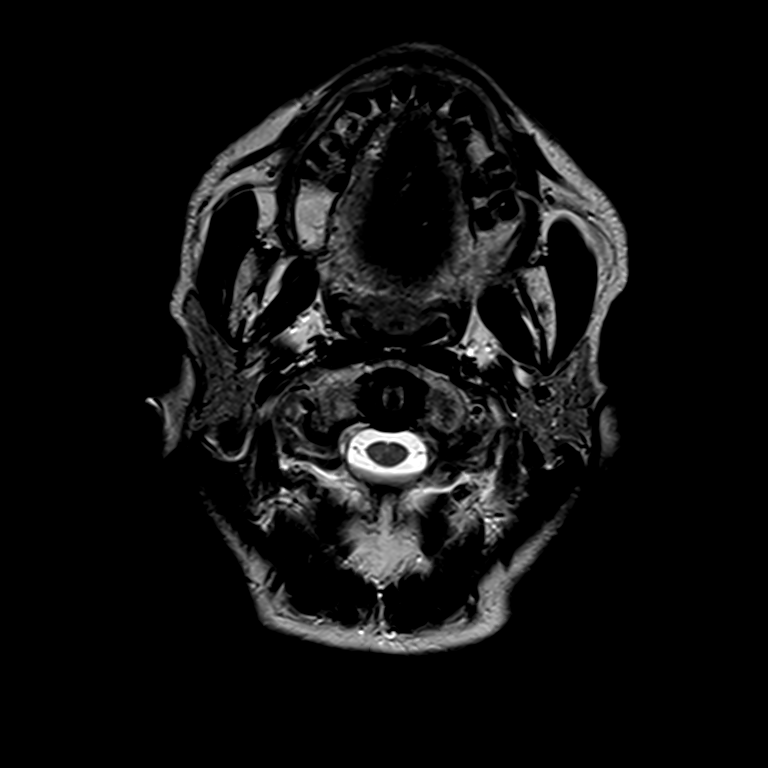

[Series 9: swi_images · axial · 2.0mm · 0.90mm/px · z∈[-61,+97]mm · 5 of 80 slices shown]
[im 1/80]
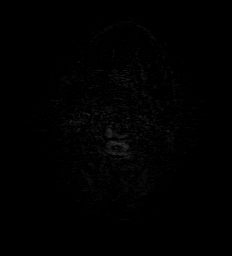
[im 20/80]
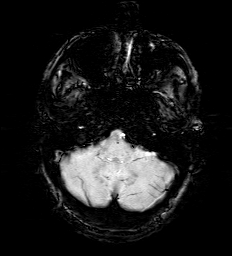
[im 40/80]
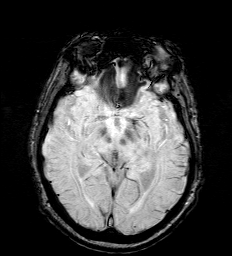
[im 60/80]
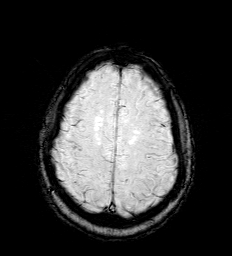
[im 80/80]
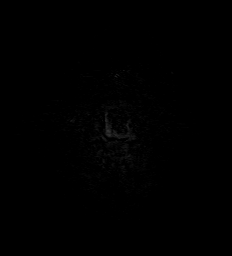

[Series 10: FLAIR · axial · 5.0mm · 0.45mm/px · 1 of 22 slices shown (1 of 2)]
[im 1/22]
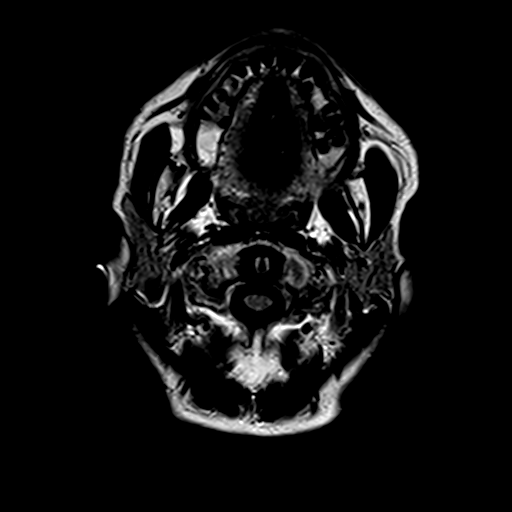

[Series 12: t1_tse cor_3mm · coronal · 3.0mm · 0.35mm/px · 2 of 30 slices shown]
[im 1/30]
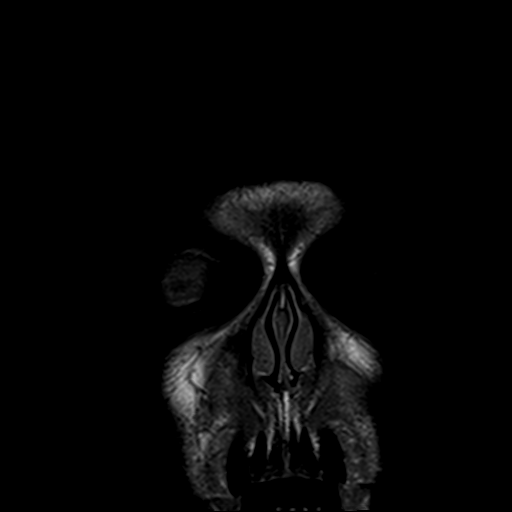
[im 30/30]
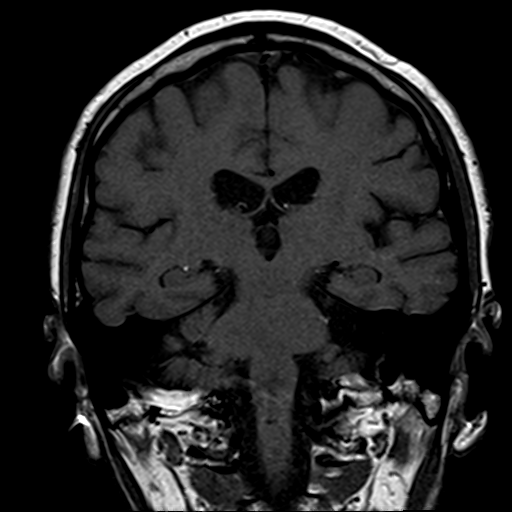

[Series 13: t2_cor_fs_3mm · coronal · 3.0mm · 0.35mm/px · 2 of 30 slices shown]
[im 1/30]
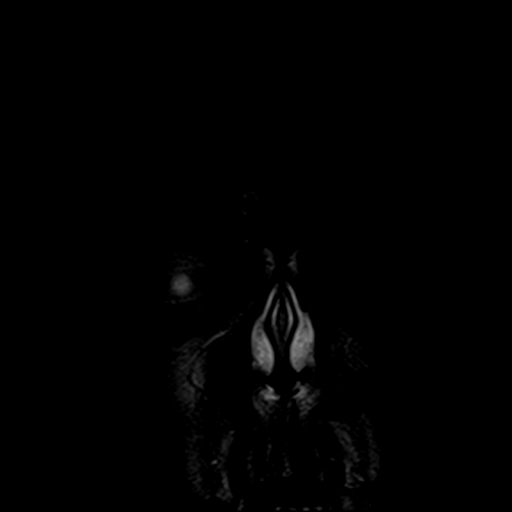
[im 30/30]
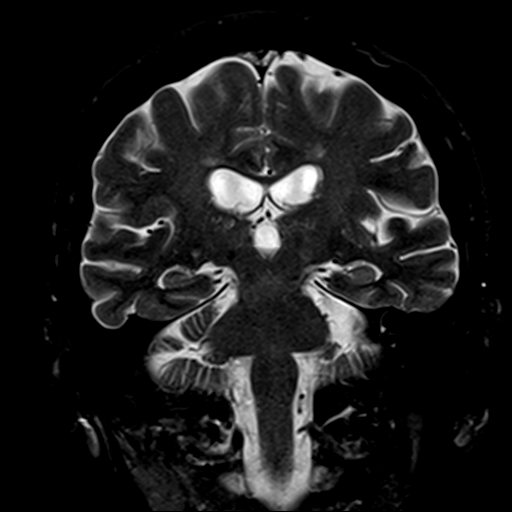

[Series 16: FLAIR · sagittal · 5.0mm · 0.45mm/px · 1 of 19 slices shown (2 of 2)]
[im 1/19]
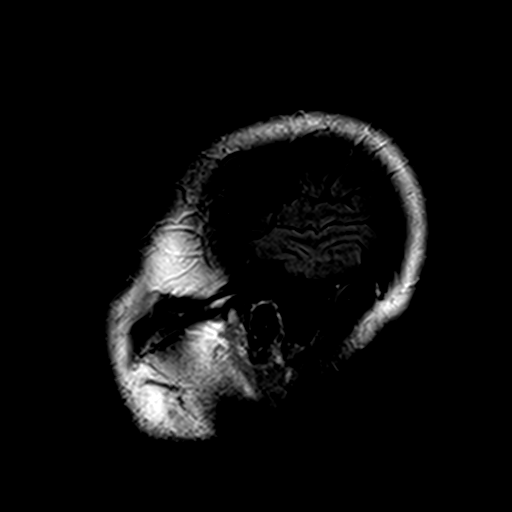

[Series 17: T2 · coronal · 5.0mm · 0.45mm/px · 2 of 25 slices shown (2 of 2)]
[im 1/25]
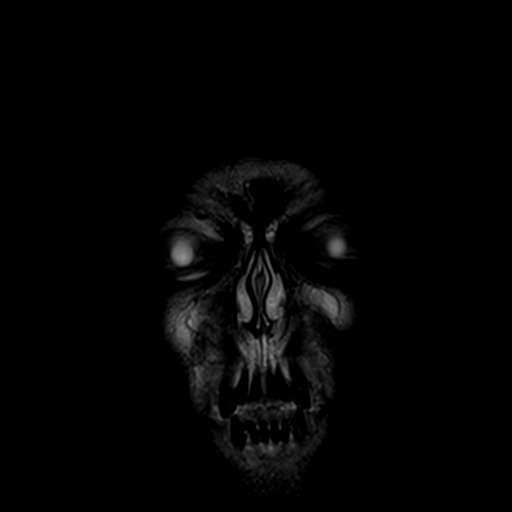
[im 25/25]
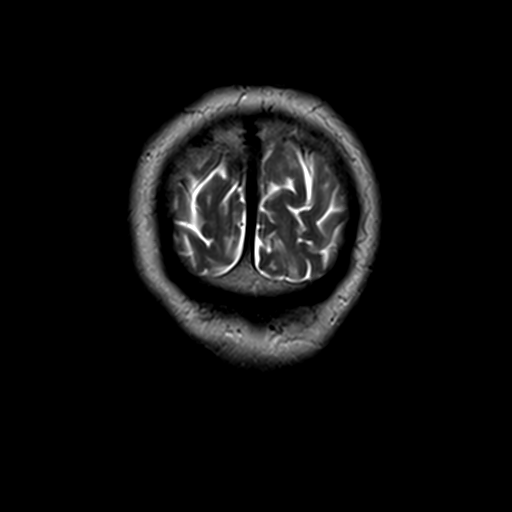

[Series 20: T1 post-contrast · coronal · 5.0mm · 0.43mm/px · 2 of 25 slices shown]
[im 1/25]
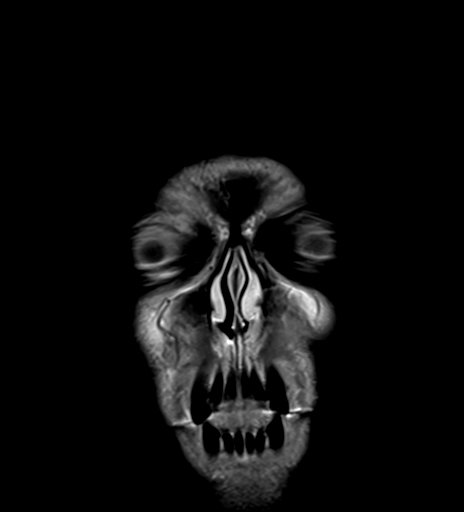
[im 25/25]
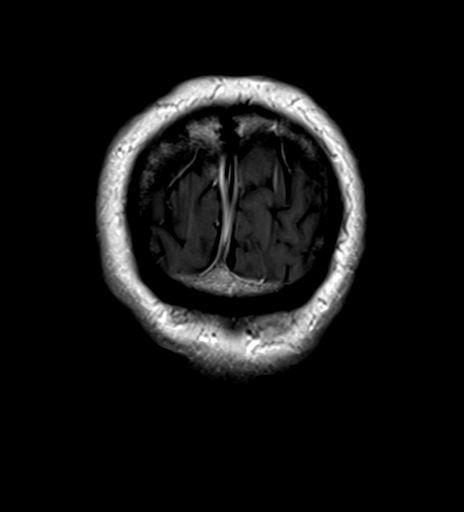

[33 of 48 positions shown; findings below may reference images not displayed]

FINDINGS: MRI HEAD FINDINGS

There is no evidence of acute infarct, intracranial hemorrhage,
mass, midline shift, or extra-axial fluid collection. Ventricles and
sulci are within normal limits for age. Patchy T2 hyperintensities
are present in the periventricular white matter, nonspecific but
compatible with mild chronic small vessel ischemic disease. A few
punctate foci of T2 hyperintensity in the right cerebellum may also
reflect chronic small vessel ischemic change. There is no abnormal
enhancement.

Mild bilateral ethmoid air cell and moderate left maxillary sinus
mucosal thickening is noted. Mastoid air cells are clear. Major
intracranial vascular flow voids are preserved.

MRI ORBITS FINDINGS

Prior left cataract extraction is noted. Right globe is intact.
There is slightly asymmetric fluid in the optic nerve sheath on the
left, with question of very mild left optic nerve atrophy. No
abnormal optic nerve T2 signal or enhancement is identified. Optic
chiasm is unremarkable. Extraocular muscles and lacrimal glands are
unremarkable. Orbital fat is normal in appearance without evidence
of inflammation or mass.
IMPRESSION: 1. No evidence of acute intracranial abnormality or mass.
2. Mild chronic small vessel ischemic disease.
3. Question mild left optic nerve atrophy.

## 2016-07-03 ENCOUNTER — Other Ambulatory Visit: Payer: Self-pay | Admitting: Endocrinology

## 2016-07-14 ENCOUNTER — Other Ambulatory Visit: Payer: Self-pay | Admitting: Endocrinology

## 2016-07-22 ENCOUNTER — Other Ambulatory Visit: Payer: Self-pay | Admitting: Endocrinology

## 2016-07-26 ENCOUNTER — Ambulatory Visit (INDEPENDENT_AMBULATORY_CARE_PROVIDER_SITE_OTHER): Payer: Medicare Other | Admitting: Endocrinology

## 2016-07-26 ENCOUNTER — Encounter: Payer: Self-pay | Admitting: Endocrinology

## 2016-07-26 VITALS — BP 132/84 | HR 78 | Ht 67.0 in | Wt 164.0 lb

## 2016-07-26 DIAGNOSIS — Z23 Encounter for immunization: Secondary | ICD-10-CM

## 2016-07-26 DIAGNOSIS — E538 Deficiency of other specified B group vitamins: Secondary | ICD-10-CM | POA: Diagnosis not present

## 2016-07-26 DIAGNOSIS — E119 Type 2 diabetes mellitus without complications: Secondary | ICD-10-CM

## 2016-07-26 DIAGNOSIS — Z0000 Encounter for general adult medical examination without abnormal findings: Secondary | ICD-10-CM | POA: Diagnosis not present

## 2016-07-26 LAB — POCT GLYCOSYLATED HEMOGLOBIN (HGB A1C): Hemoglobin A1C: 5.5

## 2016-07-26 MED ORDER — CYANOCOBALAMIN 1000 MCG/ML IJ SOLN
1000.0000 ug | Freq: Once | INTRAMUSCULAR | Status: AC
  Administered 2016-07-26: 1000 ug via INTRAMUSCULAR

## 2016-07-26 MED ORDER — GLIMEPIRIDE 1 MG PO TABS: 0.5000 mg | ORAL_TABLET | Freq: Every day | ORAL | 5 refills | Status: DC

## 2016-07-26 NOTE — Patient Instructions (Addendum)
Please reduce the glimepiride to 0.5 mg daily. Please consider these measures for your health:  minimize alcohol.  Do not use tobacco products.  Have a colonoscopy at least every 10 years from age 70.  Keep firearms safely stored.  Always use seat belts.  have working smoke alarms in your home.  See an eye doctor and dentist regularly.  Never drive under the influence of alcohol or drugs (including prescription drugs).   Please come back for a "medicare wellness" appointment in 6 months (must be after 01/23/17).

## 2016-07-26 NOTE — Progress Notes (Signed)
Subjective:    Patient ID: Ricardo Dawson, male    DOB: [birth year], 70 y.o.   MRN: [id number]  HPI Pt is here for regular wellness examination, and is feeling pretty well in general, and says chronic med probs are stable, except as noted below Past Medical History:  Diagnosis Date  . Diabetes mellitus    Type II  . Hyperlipidemia   . Hypertension     No past surgical history on file.  Social History   Social History  . Marital status: Married    Spouse name: N/A  . Number of children: N/A  . Years of education: N/A   Occupational History  . Not on file.   Social History Main Topics  . Smoking status: Current Some Day Smoker    Types: Cigars  . Smokeless tobacco: Not on file  . Alcohol use Yes     Comment: Rare  . Drug use: No  . Sexual activity: Not on file   Other Topics Concern  . Not on file   Social History Narrative  . No narrative on file    Current Outpatient Prescriptions on File Prior to Visit  Medication Sig Dispense Refill  . aspirin 81 MG tablet Take 81 mg by mouth daily.      . carvedilol (COREG) 6.25 MG tablet TAKE 1 TABLET (6.25 MG TOTAL) BY MOUTH 2 (TWO) TIMES DAILY WITH A MEAL. 60 tablet 10  . cyanocobalamin (,VITAMIN B-12,) 1000 MCG/ML injection Inject 1,000 mcg into the muscle every 30 (thirty) days.    Marland Kitchen losartan-hydrochlorothiazide (HYZAAR) 100-25 MG tablet TAKE 1 TABLET BY MOUTH DAILY. 90 tablet 0  . metFORMIN (GLUCOPHAGE) 500 MG tablet TAKE 1 TABLET BY  MOUTH 2 TIMES A DAY WITH FOOD. 60 tablet 2  . simvastatin (ZOCOR) 10 MG tablet TAKE 1 TABLET (10 MG TOTAL) BY MOUTH AT BEDTIME. 90 tablet 0  . thiamine 250 MG tablet Take 250 mg by mouth daily.     No current facility-administered medications on file prior to visit.     Allergies  Allergen Reactions  . Ace Inhibitors     REACTION: cough 2002--altace    Family History  Problem Relation Age of Onset  . Cancer Neg Hx     BP 132/84   Pulse 78   Ht 5\' 7"  (1.702 m)   Wt 164 lb  (74.4 kg)   SpO2 98%   BMI 25.69 kg/m    Review of Systems  Constitutional: Negative for fever.  HENT: Negative for hearing loss.   Eyes: Negative for visual disturbance.  Respiratory: Negative for shortness of breath.   Cardiovascular: Negative for chest pain.  Gastrointestinal: Negative for blood in stool.  Endocrine: Negative for cold intolerance.  Genitourinary: Negative for hematuria.  Musculoskeletal: Negative for back pain.  Skin: Negative for rash.  Allergic/Immunologic: Negative for environmental allergies.  Neurological: Negative for numbness.  Hematological: Does not bruise/bleed easily.  Psychiatric/Behavioral: Negative for dysphoric mood.      Objective:   Physical Exam VS: see vs page GEN: no distress HEAD: head: no deformity eyes: no periorbital swelling, no proptosis external nose and ears are normal mouth: no lesion seen NECK: supple, thyroid is not enlarged CHEST WALL: no deformity LUNGS: clear to auscultation BREASTS:  No gynecomastia CV: reg rate and rhythm, no murmur ABD: abdomen is soft, nontender.  no hepatosplenomegaly.  not distended.  no hernia RECTAL: normal external and internal exam.  heme neg. PROSTATE:  Normal size.  No  nodule MUSCULOSKELETAL: muscle bulk and strength are grossly normal.  no obvious joint swelling.  gait is normal and steady PULSES: no carotid bruit NEURO:  cn 2-12 grossly intact.   readily moves all 4's.  SKIN:  Normal texture and temperature.  No rash or suspicious lesion is visible.   NODES:  None palpable at the neck PSYCH: alert, well-oriented.  Does not appear anxious nor depressed.     Assessment & Plan:  Wellness visit today, with problems stable, except as noted.   SEPARATE EVALUATION FOLLOWS--EACH PROBLEM HERE IS NEW, NOT RESPONDING TO TREATMENT, OR POSES SIGNIFICANT RISK TO THE PATIENT'S HEALTH: HISTORY OF THE PRESENT ILLNESS: Pt returns for f/u of diabetes mellitus: DM type: 2 Dx'ed:  2009 Complications: none.  Therapy: 2 oral meds.   DKA: never  Severe hypoglycemia: never Pancreatitis: never Other: pt has requested an inexpensive med regimen.   Interval history: he denies hypoglycemia.  no cbg record, but states cbg's are well-controlled.   PAST MEDICAL HISTORY reviewed and up to date today REVIEW OF SYSTEMS: He denies hypoglycemia PHYSICAL EXAMINATION: VITAL SIGNS:  See vs page GENERAL: no distress Pulses: dorsalis pedis intact bilat.   MSK: no deformity of the feet CV: no leg edema Skin:  no ulcer on the feet.  normal color and temp on the feet. Neuro: sensation is intact to touch on the feet LAB/XRAY RESULTS: Lab Results  Component Value Date   HGBA1C 5.5 07/26/2016  IMPRESSION: Type 2 DM: overcontrolled, for this SU-containing regimen

## 2016-08-21 ENCOUNTER — Other Ambulatory Visit: Payer: Self-pay | Admitting: Endocrinology

## 2016-08-25 ENCOUNTER — Encounter: Payer: Self-pay | Admitting: Endocrinology

## 2016-10-02 ENCOUNTER — Other Ambulatory Visit: Payer: Self-pay | Admitting: Endocrinology

## 2016-10-20 ENCOUNTER — Other Ambulatory Visit: Payer: Self-pay | Admitting: Endocrinology

## 2016-11-19 ENCOUNTER — Other Ambulatory Visit: Payer: Self-pay | Admitting: Endocrinology

## 2016-12-06 ENCOUNTER — Other Ambulatory Visit: Payer: Self-pay | Admitting: Endocrinology

## 2016-12-19 ENCOUNTER — Other Ambulatory Visit: Payer: Self-pay | Admitting: Endocrinology

## 2017-01-03 ENCOUNTER — Other Ambulatory Visit: Payer: Self-pay | Admitting: Endocrinology

## 2017-01-18 ENCOUNTER — Other Ambulatory Visit: Payer: Self-pay

## 2017-01-18 ENCOUNTER — Other Ambulatory Visit: Payer: Self-pay | Admitting: Endocrinology

## 2017-01-18 MED ORDER — LOSARTAN POTASSIUM-HCTZ 100-25 MG PO TABS: 1.0000 | ORAL_TABLET | Freq: Every day | ORAL | 2 refills | Status: DC

## 2017-01-23 ENCOUNTER — Ambulatory Visit: Payer: Medicare Other | Admitting: Endocrinology

## 2017-01-24 ENCOUNTER — Telehealth: Payer: Self-pay | Admitting: Endocrinology

## 2017-01-24 NOTE — Telephone Encounter (Signed)
Patient no showed today's appt. Please advise on how to follow up. °A. No follow up necessary. °B. Follow up urgent. Contact patient immediately. °C. Follow up necessary. Contact patient and schedule visit in ___ days. °D. Follow up advised. Contact patient and schedule visit in ____weeks. ° °

## 2017-01-24 NOTE — Telephone Encounter (Signed)
Please come back for a follow-up appointment in 6 weeks  

## 2017-01-29 NOTE — Telephone Encounter (Signed)
Number not in service.

## 2017-02-17 ENCOUNTER — Other Ambulatory Visit: Payer: Self-pay | Admitting: Endocrinology

## 2017-02-17 NOTE — Telephone Encounter (Signed)
Please refill x 1 Ov is due  

## 2017-03-17 ENCOUNTER — Other Ambulatory Visit: Payer: Self-pay | Admitting: Endocrinology

## 2017-03-17 NOTE — Telephone Encounter (Signed)
Please refill x 1 Ov is due  

## 2017-03-21 ENCOUNTER — Telehealth: Payer: Self-pay | Admitting: Endocrinology

## 2017-03-21 ENCOUNTER — Other Ambulatory Visit: Payer: Self-pay

## 2017-03-21 MED ORDER — METFORMIN HCL 500 MG PO TABS: 500.0000 mg | ORAL_TABLET | Freq: Two times a day (BID) | ORAL | 0 refills | Status: DC

## 2017-03-21 NOTE — Telephone Encounter (Signed)
Pt called in to schedule his yearly, he will need enough Metformin refilled and sent to the [facility] [place].  He has 4 pills left.

## 2017-04-07 ENCOUNTER — Other Ambulatory Visit: Payer: Self-pay | Admitting: Endocrinology

## 2017-04-17 ENCOUNTER — Ambulatory Visit (INDEPENDENT_AMBULATORY_CARE_PROVIDER_SITE_OTHER): Payer: Medicare Other | Admitting: Endocrinology

## 2017-04-17 VITALS — BP 138/82 | HR 80 | Temp 97.6°F | Ht 67.0 in | Wt 160.4 lb

## 2017-04-17 DIAGNOSIS — Z0000 Encounter for general adult medical examination without abnormal findings: Secondary | ICD-10-CM | POA: Diagnosis not present

## 2017-04-17 DIAGNOSIS — E785 Hyperlipidemia, unspecified: Secondary | ICD-10-CM | POA: Diagnosis not present

## 2017-04-17 DIAGNOSIS — I10 Essential (primary) hypertension: Secondary | ICD-10-CM

## 2017-04-17 DIAGNOSIS — E119 Type 2 diabetes mellitus without complications: Secondary | ICD-10-CM | POA: Diagnosis not present

## 2017-04-17 DIAGNOSIS — Z125 Encounter for screening for malignant neoplasm of prostate: Secondary | ICD-10-CM

## 2017-04-17 DIAGNOSIS — E538 Deficiency of other specified B group vitamins: Secondary | ICD-10-CM

## 2017-04-17 DIAGNOSIS — E118 Type 2 diabetes mellitus with unspecified complications: Secondary | ICD-10-CM

## 2017-04-17 DIAGNOSIS — D649 Anemia, unspecified: Secondary | ICD-10-CM

## 2017-04-17 LAB — POCT GLYCOSYLATED HEMOGLOBIN (HGB A1C): Hemoglobin A1C: 5.6

## 2017-04-17 MED ORDER — GLIMEPIRIDE 1 MG PO TABS: 0.5000 mg | ORAL_TABLET | Freq: Every day | ORAL | 5 refills | Status: DC

## 2017-04-17 NOTE — Patient Instructions (Addendum)
Please reduce the glimepiride to 0.5 mg daily. Please continue the same other medications Please consider these measures for your health:  minimize alcohol.  Do not use tobacco products.  Have a colonoscopy at least every 10 years from age 71.  Keep firearms safely stored.  Always use seat belts.  have working smoke alarms in your home.  See an eye doctor and dentist regularly.  Never drive under the influence of alcohol or drugs (including prescription drugs).   It is critically important to prevent falling down (keep floor areas well-lit, dry, and free of loose objects.  If you have a cane, walker, or wheelchair, you should use it, even for short trips around the house.  Wear flat-soled shoes.  Also, try not to rush).  Please come back for a follow-up appointment in 6 months.

## 2017-04-17 NOTE — Progress Notes (Signed)
Subjective:    Patient ID: Ricardo Dawson Decou, male    DOB: 25-May-1946, 71 y.o.   MRN: [id number]  HPI The state of at least three ongoing medical problems is addressed today, with interval history of each noted here: Pt returns for f/u of diabetes mellitus: DM type: 2 Dx'ed: 2009 Complications: none.  Therapy: 2 oral meds.   DKA: never  Severe hypoglycemia: never Pancreatitis: never Other: pt has requested an inexpensive med regimen.  This is a stable problem.  Interval history: he denies hypoglycemia.  no cbg record, but states cbg's are well-controlled.  HTN: pt says he takes the hyzaar as rx'ed.  Denies sob.  This is a stable problem.  Dyslipidemia: he denies chest pain.  This is a stable problem.    Past Medical History:  Diagnosis Date  . Diabetes mellitus    Type II  . Hyperlipidemia   . Hypertension     No past surgical history on file.  Social History   Social History  . Marital status: Married    Spouse name: N/A  . Number of children: N/A  . Years of education: N/A   Occupational History  . Not on file.   Social History Main Topics  . Smoking status: Current Some Day Smoker    Types: Cigars  . Smokeless tobacco: Not on file  . Alcohol use Yes     Comment: Rare  . Drug use: No  . Sexual activity: Not on file   Other Topics Concern  . Not on file   Social History Narrative  . No narrative on file    Current Outpatient Prescriptions on File Prior to Visit  Medication Sig Dispense Refill  . aspirin 81 MG tablet Take 81 mg by mouth daily.      . carvedilol (COREG) 6.25 MG tablet TAKE 1 TABLET (6.25 MG TOTAL) BY MOUTH 2 (TWO) TIMES DAILY WITH A MEAL. 60 tablet 10  . cyanocobalamin (,VITAMIN B-12,) 1000 MCG/ML injection Inject 1,000 mcg into the muscle every 30 (thirty) days.    Marland Kitchen. losartan-hydrochlorothiazide (HYZAAR) 100-25 MG tablet Take 1 tablet by mouth daily. 90 tablet 2  . metFORMIN (GLUCOPHAGE) 500 MG tablet Take 1 tablet (500 mg total) by mouth  2 (two) times daily with a meal. 60 tablet 0  . simvastatin (ZOCOR) 10 MG tablet TAKE ONE TABLET BY MOUTH DAILY AT BEDTIME 90 tablet 0  . thiamine 250 MG tablet Take 250 mg by mouth daily.     No current facility-administered medications on file prior to visit.     Allergies  Allergen Reactions  . Ace Inhibitors     REACTION: cough 2002--altace    Family History  Problem Relation Age of Onset  . Cancer Neg Hx     BP 138/82 (BP Location: Left Arm, Patient Position: Sitting, Cuff Size: Normal)   Pulse 80   Temp 97.6 F (36.4 C) (Oral)   Ht 5\' 7"  (1.702 m)   Wt 160 lb 6.4 oz (72.8 kg)   SpO2 96%   BMI 25.12 kg/m    Review of Systems denies weight change and edema    Objective:   Physical Exam VITAL SIGNS:  See vs page GENERAL: no distress Pulses: dorsalis pedis intact bilat.   MSK: no deformity of the feet CV: no leg edema Skin:  no ulcer on the feet.  normal color and temp on the feet. Neuro: sensation is intact to touch on the feet   I personally reviewed  electrocardiogram tracing (today):  Indication: DM Impression: NSR.  No MI.  No hypertrophy. Compared to 2017: no change  Lab Results  Component Value Date   HGBA1C 5.6 04/17/2017       Assessment & Plan:  HTN: well-controlled.  Please continue the same medications Type 2 DM: overcontrolled, for this SU-containing regimen.  Reduce glimepiride to 0.5 mg qd.  Dyslipidemia: check labs today   Subjective:   Patient here for Medicare annual wellness visit and management of other chronic and acute problems.     Risk factors: advanced age    Roster of Physicians Providing Medical Care to Patient:  See "snapshot"   Activities of Daily Living: In your present state of health, do you have any difficulty performing the following activities (lives with wife)?:  Preparing food and eating?: No  Bathing yourself: No  Getting dressed: No  Using the toilet:No  Moving around from place to place: No  In the  past year have you fallen or had a near fall?:No    Home Safety: Has smoke detector and wears seat belts. Firearms are safely stored.  Diet and Exercise  Current exercise habits: pt says good Dietary issues discussed: pt reports a healthy diet.  Depression Screen  Q1: Over the past two weeks, have you felt down, depressed or hopeless? no  Q2: Over the past two weeks, have you felt little interest or pleasure in doing things? no   The following portions of the patient's history were reviewed and updated as appropriate: allergies, current medications, past family history, past medical history, past social history, past surgical history and problem list.   Review of Systems  Denies hearing loss, and visual loss. Objective:   Vision:  Sees opthalmologist Dr Dione Booze, so he declines [facility] today.  Hearing: grossly normal Body mass index:  See vs page. Msk: pt easily and quickly performs "get-up-and-go" from a sitting position.  Cognitive Impairment Assessment: cognition, memory and judgment appear normal.  remembers 3/3 at 5 minutes.  excellent recall.  can easily read and write a sentence.  alert and oriented x 3.     Assessment:   Medicare wellness utd on preventive parameters.    Plan:   During the course of the visit the patient was educated and counseled about appropriate screening and preventive services including:        Fall prevention information is offered.   Diabetes care is given today.   Nutrition counseling is offered.   Vaccines are UTD LABS are ordered today  Patient Instructions (the written plan) was given to the patient.

## 2017-04-17 NOTE — Progress Notes (Signed)
we discussed code status.  pt requests full code, but would not want to be started or maintained on artificial life-support measures if there was not a reasonable chance of recovery 

## 2017-05-19 ENCOUNTER — Other Ambulatory Visit: Payer: Self-pay | Admitting: Endocrinology

## 2017-06-18 ENCOUNTER — Other Ambulatory Visit: Payer: Self-pay | Admitting: Endocrinology

## 2017-06-25 ENCOUNTER — Telehealth: Payer: Self-pay

## 2017-06-25 NOTE — Telephone Encounter (Signed)
Called PT but got no answer about cologuard fax stating they had not received his kit.

## 2017-07-06 ENCOUNTER — Other Ambulatory Visit: Payer: Self-pay | Admitting: Endocrinology

## 2017-07-18 ENCOUNTER — Other Ambulatory Visit: Payer: Self-pay | Admitting: Endocrinology

## 2017-08-05 ENCOUNTER — Other Ambulatory Visit: Payer: Self-pay | Admitting: Endocrinology

## 2017-08-27 ENCOUNTER — Other Ambulatory Visit: Payer: Self-pay | Admitting: Endocrinology

## 2017-09-03 ENCOUNTER — Telehealth: Payer: Self-pay | Admitting: Endocrinology

## 2017-09-03 NOTE — Telephone Encounter (Signed)
Please call in prescription for Macformin. Enough to last until his Dec 5th appointment. [facility] If questions please call patient

## 2017-09-05 ENCOUNTER — Other Ambulatory Visit: Payer: Self-pay

## 2017-09-05 MED ORDER — METFORMIN HCL 500 MG PO TABS: ORAL_TABLET | ORAL | 0 refills | Status: DC

## 2017-09-05 NOTE — Telephone Encounter (Signed)
Done

## 2017-09-19 ENCOUNTER — Other Ambulatory Visit: Payer: Self-pay | Admitting: Endocrinology

## 2017-10-17 ENCOUNTER — Encounter: Payer: Self-pay | Admitting: Endocrinology

## 2017-10-17 ENCOUNTER — Ambulatory Visit (INDEPENDENT_AMBULATORY_CARE_PROVIDER_SITE_OTHER): Payer: Medicare Other | Admitting: Endocrinology

## 2017-10-17 VITALS — BP 140/78 | HR 71 | Wt 159.2 lb

## 2017-10-17 DIAGNOSIS — D649 Anemia, unspecified: Secondary | ICD-10-CM

## 2017-10-17 DIAGNOSIS — Z125 Encounter for screening for malignant neoplasm of prostate: Secondary | ICD-10-CM | POA: Diagnosis not present

## 2017-10-17 DIAGNOSIS — Z0001 Encounter for general adult medical examination with abnormal findings: Secondary | ICD-10-CM | POA: Diagnosis not present

## 2017-10-17 DIAGNOSIS — Z23 Encounter for immunization: Secondary | ICD-10-CM | POA: Diagnosis not present

## 2017-10-17 DIAGNOSIS — E119 Type 2 diabetes mellitus without complications: Secondary | ICD-10-CM

## 2017-10-17 DIAGNOSIS — Z119 Encounter for screening for infectious and parasitic diseases, unspecified: Secondary | ICD-10-CM

## 2017-10-17 DIAGNOSIS — E875 Hyperkalemia: Secondary | ICD-10-CM

## 2017-10-17 LAB — POCT GLYCOSYLATED HEMOGLOBIN (HGB A1C): Hemoglobin A1C: 5.4

## 2017-10-17 LAB — CBC WITH DIFFERENTIAL/PLATELET
Basophils Absolute: 0 10*3/uL (ref 0.0–0.1)
Basophils Relative: 0.7 % (ref 0.0–3.0)
Eosinophils Absolute: 0.1 10*3/uL (ref 0.0–0.7)
Eosinophils Relative: 1.3 % (ref 0.0–5.0)
HCT: 39.3 % (ref 39.0–52.0)
Hemoglobin: 13 g/dL (ref 13.0–17.0)
Lymphocytes Relative: 28.2 % (ref 12.0–46.0)
Lymphs Abs: 1.3 10*3/uL (ref 0.7–4.0)
MCHC: 33.2 g/dL (ref 30.0–36.0)
MCV: 92.7 fl (ref 78.0–100.0)
Monocytes Absolute: 0.6 10*3/uL (ref 0.1–1.0)
Monocytes Relative: 12.2 % — ABNORMAL HIGH (ref 3.0–12.0)
Neutro Abs: 2.7 10*3/uL (ref 1.4–7.7)
Neutrophils Relative %: 57.6 % (ref 43.0–77.0)
Platelets: 274 10*3/uL (ref 150.0–400.0)
RBC: 4.24 Mil/uL (ref 4.22–5.81)
RDW: 14.4 % (ref 11.5–15.5)
WBC: 4.7 10*3/uL (ref 4.0–10.5)

## 2017-10-17 LAB — IBC PANEL
Iron: 72 ug/dL (ref 42–165)
Saturation Ratios: 17.3 % — ABNORMAL LOW (ref 20.0–50.0)
Transferrin: 297 mg/dL (ref 212.0–360.0)

## 2017-10-17 LAB — HEPATIC FUNCTION PANEL
ALT: 20 U/L (ref 0–53)
AST: 28 U/L (ref 0–37)
Albumin: 4.8 g/dL (ref 3.5–5.2)
Alkaline Phosphatase: 42 U/L (ref 39–117)
Bilirubin, Direct: 0.1 mg/dL (ref 0.0–0.3)
Total Bilirubin: 0.6 mg/dL (ref 0.2–1.2)
Total Protein: 7.3 g/dL (ref 6.0–8.3)

## 2017-10-17 LAB — MICROALBUMIN / CREATININE URINE RATIO
Creatinine,U: 188.6 mg/dL
Microalb Creat Ratio: 0.4 mg/g (ref 0.0–30.0)
Microalb, Ur: <0.7 mg/dL (ref 0.0–1.9)

## 2017-10-17 LAB — URINALYSIS, ROUTINE W REFLEX MICROSCOPIC
Bilirubin Urine: NEGATIVE
Hgb urine dipstick: NEGATIVE
Ketones, ur: NEGATIVE
Leukocytes, UA: NEGATIVE
Nitrite: NEGATIVE
RBC / HPF: NONE SEEN (ref 0–?)
Specific Gravity, Urine: >=1.03 — AB (ref 1.000–1.030)
Total Protein, Urine: NEGATIVE
Urine Glucose: NEGATIVE
Urobilinogen, UA: 0.2 (ref 0.0–1.0)
WBC, UA: NONE SEEN (ref 0–?)
pH: 5.5 (ref 5.0–8.0)

## 2017-10-17 LAB — TSH: TSH: 1.87 u[IU]/mL (ref 0.35–4.50)

## 2017-10-17 LAB — LIPID PANEL
Cholesterol: 108 mg/dL (ref 0–200)
HDL: 68.3 mg/dL (ref >39.00)
LDL Cholesterol: 34 mg/dL (ref 0–99)
NonHDL: 40.1
Total CHOL/HDL Ratio: 2
Triglycerides: 31 mg/dL (ref 0.0–149.0)
VLDL: 6.2 mg/dL (ref 0.0–40.0)

## 2017-10-17 LAB — BASIC METABOLIC PANEL
BUN: 11 mg/dL (ref 6–23)
CO2: 27 mEq/L (ref 19–32)
Calcium: 9.7 mg/dL (ref 8.4–10.5)
Chloride: 105 mEq/L (ref 96–112)
Creatinine, Ser: 1.18 mg/dL (ref 0.40–1.50)
GFR: 77.87 mL/min (ref >60.00)
Glucose, Bld: 117 mg/dL — ABNORMAL HIGH (ref 70–99)
Potassium: 5.5 mEq/L — ABNORMAL HIGH (ref 3.5–5.1)
Sodium: 140 mEq/L (ref 135–145)

## 2017-10-17 LAB — PSA: PSA: 3.02 ng/mL (ref 0.10–4.00)

## 2017-10-17 MED ORDER — METFORMIN HCL ER 500 MG PO TB24: 2000.0000 mg | ORAL_TABLET | Freq: Every day | ORAL | 3 refills | Status: DC

## 2017-10-17 MED ORDER — FUROSEMIDE 20 MG PO TABS: 20.0000 mg | ORAL_TABLET | Freq: Every day | ORAL | 3 refills | Status: DC

## 2017-10-17 NOTE — Patient Instructions (Addendum)
Please stop taking the glimepiride pill. blood tests are requested for you today.  We'll let you know about the results. Please consider these measures for your health:  minimize alcohol.  Do not use tobacco products.  Have a colonoscopy at least every 10 years from age 89.  Keep firearms safely stored.  Always use seat belts.  have working smoke alarms in your home.  See an eye doctor and dentist regularly.  Never drive under the influence of alcohol or drugs (including prescription drugs).   It is critically important to prevent falling down (keep floor areas well-lit, dry, and free of loose objects.  If you have a cane, walker, or wheelchair, you should use it, even for short trips around the house.  Wear flat-soled shoes.  Also, try not to rush).  Please come back for a follow-up appointment in 6 months.

## 2017-10-17 NOTE — Progress Notes (Signed)
Subjective:    Patient ID: Ricardo Dawson, male    DOB: [birth year], 71 y.o.   MRN: [id number]  HPI Pt is here for regular wellness examination, and is feeling pretty well in general, and says chronic med probs are stable, except as noted below Past Medical History:  Diagnosis Date  . Diabetes mellitus    Type II  . Hyperlipidemia   . Hypertension     History reviewed. No pertinent surgical history.  Social History   Socioeconomic History  . Marital status: Married    Spouse name: Not on file  . Number of children: Not on file  . Years of education: Not on file  . Highest education level: Not on file  Social Needs  . Financial resource strain: Not on file  . Food insecurity - worry: Not on file  . Food insecurity - inability: Not on file  . Transportation needs - medical: Not on file  . Transportation needs - non-medical: Not on file  Occupational History  . Not on file  Tobacco Use  . Smoking status: Current Some Day Smoker    Types: Cigars  Substance and Sexual Activity  . Alcohol use: Yes    Comment: Rare  . Drug use: No  . Sexual activity: Not on file  Other Topics Concern  . Not on file  Social History Narrative  . Not on file    Current Outpatient Medications on File Prior to Visit  Medication Sig Dispense Refill  . aspirin 81 MG tablet Take 81 mg by mouth daily.      . carvedilol (COREG) 6.25 MG tablet TAKE ONE TABLET BY MOUTH TWICE A DAY WITH A MEAL 60 tablet 9  . cyanocobalamin (,VITAMIN B-12,) 1000 MCG/ML injection Inject 1,000 mcg into the muscle every 30 (thirty) days.    Marland Kitchen. losartan-hydrochlorothiazide (HYZAAR) 100-25 MG tablet Take 1 tablet by mouth daily. 90 tablet 2  . simvastatin (ZOCOR) 10 MG tablet TAKE ONE TABLET BY MOUTH AT BEDTIME 90 tablet 0  . thiamine 250 MG tablet Take 250 mg by mouth daily.     No current facility-administered medications on file prior to visit.     Allergies  Allergen Reactions  . Ace Inhibitors     REACTION:  cough 2002--altace    Family History  Problem Relation Age of Onset  . Cancer Neg Hx     BP 140/78 (BP Location: Left Arm, Patient Position: Sitting, Cuff Size: Normal)   Pulse 71   Wt 159 lb 3.2 oz (72.2 kg)   SpO2 98%   BMI 24.93 kg/m     Review of Systems Denies fever, fatigue, visual loss, hearing loss, chest pain, sob, back pain, depression, cold intolerance, BRBPR, hematuria, syncope, numbness, allergy sxs, easy bruising, and rash.      Objective:   Physical Exam VS: see vs page GEN: no distress HEAD: head: no deformity eyes: no periorbital swelling, no proptosis external nose and ears are normal mouth: no lesion seen NECK: supple, thyroid is not enlarged CHEST WALL: no deformity LUNGS: clear to auscultation BREASTS:  No gynecomastia CV: reg rate and rhythm, no murmur ABD: abdomen is soft, nontender.  no hepatosplenomegaly.  not distended.  no hernia.   RECTAL/PROSTATE: declined MUSCULOSKELETAL: muscle bulk and strength are grossly normal.  no obvious joint swelling.  gait is normal and steady.    PULSES: no carotid bruit NEURO:  cn 2-12 grossly intact.   readily moves all 4's.    SKIN:  Normal texture and temperature.  No rash or suspicious lesion is visible.   NODES:  None palpable at the neck.  PSYCH: alert, well-oriented.  Does not appear anxious nor depressed.    denies hypoglycemia.  There is bilateral onychomycosis of the toenails     Assessment & Plan:  Wellness visit today, with problems stable, except as noted.   SEPARATE EVALUATION FOLLOWS--EACH PROBLEM HERE IS NEW, NOT RESPONDING TO TREATMENT, OR POSES SIGNIFICANT RISK TO THE PATIENT'S HEALTH: HISTORY OF THE PRESENT ILLNESS: Pt returns for f/u of diabetes mellitus: DM type: 2 Dx'ed: 2009 Complications: none.  Therapy: 2 oral meds.   DKA: never  Severe hypoglycemia: never Pancreatitis: never Other: pt has requested an inexpensive med regimen.  This is a stable problem.  Interval history:  he says cbg's are well-controlled PAST MEDICAL HISTORY Past Medical History:  Diagnosis Date  . Diabetes mellitus    Type II  . Hyperlipidemia   . Hypertension     History reviewed. No pertinent surgical history.  Social History   Socioeconomic History  . Marital status: Married    Spouse name: Not on file  . Number of children: Not on file  . Years of education: Not on file  . Highest education level: Not on file  Social Needs  . Financial resource strain: Not on file  . Food insecurity - worry: Not on file  . Food insecurity - inability: Not on file  . Transportation needs - medical: Not on file  . Transportation needs - non-medical: Not on file  Occupational History  . Not on file  Tobacco Use  . Smoking status: Current Some Day Smoker    Types: Cigars  Substance and Sexual Activity  . Alcohol use: Yes    Comment: Rare  . Drug use: No  . Sexual activity: Not on file  Other Topics Concern  . Not on file  Social History Narrative  . Not on file    Current Outpatient Medications on File Prior to Visit  Medication Sig Dispense Refill  . aspirin 81 MG tablet Take 81 mg by mouth daily.      . carvedilol (COREG) 6.25 MG tablet TAKE ONE TABLET BY MOUTH TWICE A DAY WITH A MEAL 60 tablet 9  . cyanocobalamin (,VITAMIN B-12,) 1000 MCG/ML injection Inject 1,000 mcg into the muscle every 30 (thirty) days.    Marland Kitchen losartan-hydrochlorothiazide (HYZAAR) 100-25 MG tablet Take 1 tablet by mouth daily. 90 tablet 2  . simvastatin (ZOCOR) 10 MG tablet TAKE ONE TABLET BY MOUTH AT BEDTIME 90 tablet 0  . thiamine 250 MG tablet Take 250 mg by mouth daily.     No current facility-administered medications on file prior to visit.     Allergies  Allergen Reactions  . Ace Inhibitors     REACTION: cough 2002--altace    Family History  Problem Relation Age of Onset  . Cancer Neg Hx     BP 140/78 (BP Location: Left Arm, Patient Position: Sitting, Cuff Size: Normal)   Pulse 71   Wt  159 lb 3.2 oz (72.2 kg)   SpO2 98%   BMI 24.93 kg/m   REVIEW OF SYSTEMS: He denies hypoglycemia PHYSICAL EXAMINATION: VITAL SIGNS:  See vs page GENERAL: no distress Pulses: foot pulses are intact bilaterally.   MSK: no deformity of the feet or ankles.  CV: no edema of the legs or ankles Skin:  no ulcer on the feet or ankles.  normal color and temp on the  feet and ankles Neuro: sensation is intact to touch on the feet and ankles.   LAB/XRAY RESULTS: Lab Results  Component Value Date   HGBA1C 5.4 10/17/2017   IMPRESSION: Type 2 DM: overcontrolled PLAN:  D/c amaryl

## 2017-10-18 LAB — HEPATITIS C ANTIBODY
Hepatitis C Ab: NONREACTIVE
SIGNAL TO CUT-OFF: 0.01 (ref <1.00)

## 2017-10-19 ENCOUNTER — Telehealth: Payer: Self-pay | Admitting: Endocrinology

## 2017-10-19 NOTE — Telephone Encounter (Signed)
Pt is

## 2017-10-24 ENCOUNTER — Other Ambulatory Visit: Payer: Self-pay | Admitting: Endocrinology

## 2017-10-24 MED ORDER — METFORMIN HCL ER 500 MG PO TB24: 1000.0000 mg | ORAL_TABLET | Freq: Every day | ORAL | 3 refills | Status: DC

## 2017-11-15 ENCOUNTER — Other Ambulatory Visit: Payer: Self-pay | Admitting: Endocrinology

## 2017-11-15 ENCOUNTER — Other Ambulatory Visit (INDEPENDENT_AMBULATORY_CARE_PROVIDER_SITE_OTHER): Payer: Medicare Other

## 2017-11-15 DIAGNOSIS — E875 Hyperkalemia: Secondary | ICD-10-CM

## 2017-11-15 LAB — BASIC METABOLIC PANEL
BUN: 12 mg/dL (ref 6–23)
CO2: 30 mEq/L (ref 19–32)
Calcium: 9.9 mg/dL (ref 8.4–10.5)
Chloride: 104 mEq/L (ref 96–112)
Creatinine, Ser: 1.36 mg/dL (ref 0.40–1.50)
GFR: 66.09 mL/min (ref >60.00)
Glucose, Bld: 133 mg/dL — ABNORMAL HIGH (ref 70–99)
Potassium: 5 mEq/L (ref 3.5–5.1)
Sodium: 140 mEq/L (ref 135–145)

## 2017-11-15 MED ORDER — METFORMIN HCL ER 500 MG PO TB24: 1000.0000 mg | ORAL_TABLET | Freq: Every day | ORAL | 3 refills | Status: DC

## 2017-12-14 ENCOUNTER — Other Ambulatory Visit: Payer: Self-pay | Admitting: Endocrinology

## 2018-02-24 ENCOUNTER — Other Ambulatory Visit: Payer: Self-pay | Admitting: Endocrinology

## 2018-04-08 ENCOUNTER — Other Ambulatory Visit: Payer: Self-pay | Admitting: Endocrinology

## 2018-04-17 ENCOUNTER — Ambulatory Visit (INDEPENDENT_AMBULATORY_CARE_PROVIDER_SITE_OTHER): Payer: Medicare HMO | Admitting: Endocrinology

## 2018-04-17 ENCOUNTER — Encounter: Payer: Self-pay | Admitting: Endocrinology

## 2018-04-17 VITALS — BP 110/62 | HR 81 | Wt 146.0 lb

## 2018-04-17 DIAGNOSIS — E875 Hyperkalemia: Secondary | ICD-10-CM | POA: Diagnosis not present

## 2018-04-17 DIAGNOSIS — E538 Deficiency of other specified B group vitamins: Secondary | ICD-10-CM

## 2018-04-17 DIAGNOSIS — Z0000 Encounter for general adult medical examination without abnormal findings: Secondary | ICD-10-CM | POA: Diagnosis not present

## 2018-04-17 DIAGNOSIS — D649 Anemia, unspecified: Secondary | ICD-10-CM

## 2018-04-17 DIAGNOSIS — E119 Type 2 diabetes mellitus without complications: Secondary | ICD-10-CM

## 2018-04-17 DIAGNOSIS — E118 Type 2 diabetes mellitus with unspecified complications: Secondary | ICD-10-CM

## 2018-04-17 DIAGNOSIS — Z125 Encounter for screening for malignant neoplasm of prostate: Secondary | ICD-10-CM

## 2018-04-17 DIAGNOSIS — I10 Essential (primary) hypertension: Secondary | ICD-10-CM

## 2018-04-17 LAB — POCT GLYCOSYLATED HEMOGLOBIN (HGB A1C): Hemoglobin A1C: 5.6 % (ref 4.0–5.6)

## 2018-04-17 LAB — BASIC METABOLIC PANEL
BUN: 12 mg/dL (ref 6–23)
CO2: 31 mEq/L (ref 19–32)
Calcium: 10.1 mg/dL (ref 8.4–10.5)
Chloride: 102 mEq/L (ref 96–112)
Creatinine, Ser: 1.25 mg/dL (ref 0.40–1.50)
GFR: 72.76 mL/min (ref >60.00)
Glucose, Bld: 126 mg/dL — ABNORMAL HIGH (ref 70–99)
Potassium: 5.4 mEq/L — ABNORMAL HIGH (ref 3.5–5.1)
Sodium: 139 mEq/L (ref 135–145)

## 2018-04-17 MED ORDER — CARVEDILOL 3.125 MG PO TABS: 3.1250 mg | ORAL_TABLET | Freq: Two times a day (BID) | ORAL | 3 refills | Status: DC

## 2018-04-17 MED ORDER — METFORMIN HCL ER 500 MG PO TB24: 500.0000 mg | ORAL_TABLET | Freq: Every day | ORAL | 3 refills | Status: DC

## 2018-04-17 NOTE — Progress Notes (Signed)
we discussed code status.  pt requests full code, but would not want to be started or maintained on artificial life-support measures if there was not a reasonable chance of recovery 

## 2018-04-17 NOTE — Patient Instructions (Addendum)
Please reduce the metformin to 1 pill per day, and:  half the carvedilol.  I have sent prescriptions to your pharmacy, for these changes.   Please consider these measures for your health:  minimize alcohol.  Do not use tobacco products.  Have a colonoscopy at least every 10 years from age 72.  Keep firearms safely stored.  Always use seat belts.  have working smoke alarms in your home.  See an eye doctor and dentist regularly.  Never drive under the influence of alcohol or drugs (including prescription drugs).   It is critically important to prevent falling down (keep floor areas well-lit, dry, and free of loose objects.  If you have a cane, walker, or wheelchair, you should use it, even for short trips around the house.  Wear flat-soled shoes.  Also, try not to rush). good diet and exercise significantly improve the control of your diabetes.  please let me know if you wish to be referred to a dietician.  high blood sugar is very risky to your health.  you should see an eye doctor and dentist every year.  It is very important to get all recommended vaccinations.   blood tests are requested for you today.  We'll let you know about the results. Please come back for a regular physical appointment in 6 months (after 10/17/18).

## 2018-04-17 NOTE — Progress Notes (Signed)
Subjective:    Patient ID: Ricardo Dawson, male    DOB: 12-13-1945, 72 y.o.   MRN: [id number]  HPI Pt returns for f/u of diabetes mellitus: DM type: 2 Dx'ed: 2009 Complications: none.  Therapy: metformin   DKA: never  Severe hypoglycemia: never Pancreatitis: never Other: pt has requested an inexpensive med regimen.  This is a stable problem.  Interval history: he says cbg's are well-controlled.   Past Medical History:  Diagnosis Date  . Diabetes mellitus    Type II  . Hyperlipidemia   . Hypertension     History reviewed. No pertinent surgical history.  Social History   Socioeconomic History  . Marital status: Married    Spouse name: Not on file  . Number of children: Not on file  . Years of education: Not on file  . Highest education level: Not on file  Occupational History  . Not on file  Social Needs  . Financial resource strain: Not on file  . Food insecurity:    Worry: Not on file    Inability: Not on file  . Transportation needs:    Medical: Not on file    Non-medical: Not on file  Tobacco Use  . Smoking status: Current Some Day Smoker    Types: Cigars  Substance and Sexual Activity  . Alcohol use: Yes    Comment: Rare  . Drug use: No  . Sexual activity: Yes  Lifestyle  . Physical activity:    Days per week: Not on file    Minutes per session: Not on file  . Stress: Not on file  Relationships  . Social connections:    Talks on phone: Not on file    Gets together: Not on file    Attends religious service: Not on file    Active member of club or organization: Not on file    Attends meetings of clubs or organizations: Not on file    Relationship status: Not on file  . Intimate partner violence:    Fear of current or ex partner: Not on file    Emotionally abused: Not on file    Physically abused: Not on file    Forced sexual activity: Not on file  Other Topics Concern  . Not on file  Social History Narrative  . Not on file    Current  Outpatient Medications on File Prior to Visit  Medication Sig Dispense Refill  . aspirin 81 MG tablet Take 81 mg by mouth daily.      . cyanocobalamin (,VITAMIN B-12,) 1000 MCG/ML injection Inject 1,000 mcg into the muscle every 30 (thirty) days.    . furosemide (LASIX) 20 MG tablet TAKE ONE TABLET BY MOUTH DAILY 30 tablet 2  . losartan-hydrochlorothiazide (HYZAAR) 100-25 MG tablet TAKE ONE TABLET BY MOUTH DAILY 90 tablet 1  . simvastatin (ZOCOR) 10 MG tablet TAKE ONE TABLET BY MOUTH AT BEDTIME 90 tablet 0  . thiamine 250 MG tablet Take 250 mg by mouth daily.     No current facility-administered medications on file prior to visit.     Allergies  Allergen Reactions  . Ace Inhibitors     REACTION: cough 2002--altace    Family History  Problem Relation Age of Onset  . Cancer Neg Hx     BP 110/62   Pulse 81   Wt 146 lb (66.2 kg)   SpO2 97%   BMI 22.87 kg/m    Review of Systems He denies hypoglycemia  Objective:   Physical Exam VITAL SIGNS:  See vs page GENERAL: no distress Pulses: foot pulses are intact bilaterally.   MSK: no deformity of the feet or ankles.  CV: no edema of the legs or ankles Skin:  no ulcer on the feet or ankles.  normal color and temp on the feet and ankles Neuro: sensation is intact to touch on the feet and ankles.    A1c=5.6%  Lab Results  Component Value Date   CREATININE 1.36 11/15/2017   BUN 12 11/15/2017   NA 140 11/15/2017   K 5.0 11/15/2017   CL 104 11/15/2017   CO2 30 11/15/2017        Assessment & Plan:  Type 2 DM: well-controlled.  Please continue the same medication HTN: well-controlled.  Please continue the same medication Hyperkalemia: recheck today  Subjective:   Patient here for Medicare annual wellness visit and management of other chronic and acute problems.     Risk factors: advanced age    Roster of Physicians Providing Medical Care to Patient:  See "snapshot"   Activities of Daily Living: In your present  state of health, do you have any difficulty performing the following activities (lives with wife)?:  Preparing food and eating?: No  Bathing yourself: No  Getting dressed: No  Using the toilet: No  Moving around from place to place: No  In the past year have you fallen or had a near fall?: No    Home Safety: Has smoke detector and wears seat belts. No firearms. No excess sun exposure.  Opioid Use: none   Diet and Exercise  Current exercise habits: pt says good Dietary issues discussed: pt reports a healthy diet   Depression Screen  Q1: Over the past two weeks, have you felt down, depressed or hopeless? no  Q2: Over the past two weeks, have you felt little interest or pleasure in doing things? no   The following portions of the patient's history were reviewed and updated as appropriate: allergies, current medications, past family history, past medical history, past social history, past surgical history and problem list.   Review of Systems  Denies hearing loss, and visual loss Objective:   Vision:  [place] opthalmologist, so he declines [facility] today.   Hearing: grossly normal Body mass index:  See vs page Msk: pt easily and quickly performs "get-up-and-go" from a sitting position Cognitive Impairment Assessment: cognition, memory and judgment appear normal.  remembers 2/3 at 5 minutes (? effort).  excellent recall.  can easily read and write a sentence.  alert and oriented x 3   Assessment:   Medicare wellness utd on preventive parameters    Plan:   During the course of the visit the patient was educated and counseled about appropriate screening and preventive services including:        Fall prevention is advised today   Diabetes screening is UTD Nutrition counseling is offered  advanced directives/end of life addressed today:  see healthcare directives hyperlink  Vaccines are updated as needed  Patient Instructions (the written plan) was given to the patient.

## 2018-04-18 ENCOUNTER — Other Ambulatory Visit: Payer: Self-pay | Admitting: Endocrinology

## 2018-04-21 NOTE — Telephone Encounter (Signed)
please call patient: Are you taking this med?

## 2018-04-22 ENCOUNTER — Other Ambulatory Visit: Payer: Self-pay | Admitting: Endocrinology

## 2018-04-23 ENCOUNTER — Other Ambulatory Visit: Payer: Self-pay

## 2018-04-23 NOTE — Telephone Encounter (Signed)
Patient stated he is no longer taking glimepiride so I called the pharmacy and canceled that medication and I removed it from his list- just an [place]

## 2018-04-29 ENCOUNTER — Telehealth: Payer: Self-pay | Admitting: Endocrinology

## 2018-04-29 NOTE — Telephone Encounter (Signed)
Patient stated he would like a call back to discuss his B1 vitamin. Please advise

## 2018-04-29 NOTE — Telephone Encounter (Signed)
I spoke with patient & he stated that [facility] no longer carries the B1 vitamin. They carry B2-B12. He said they are now specially ordered & [facility] has $100 for $30, which is too expensive. He was wondering if there was an alternative or what he could do instead? I also suggested that he try a Vitamin Store specifically, but he said he didn't have one close by & didn't seem interested in that option.

## 2018-04-30 NOTE — Telephone Encounter (Signed)
He needs to take B-12, 1 mg daily

## 2018-04-30 NOTE — Telephone Encounter (Signed)
I spoke with patient & he said that [facility] had gotten back in vitamin. I also stated that all he needed to take was the 1 mg of B-12 a day.

## 2018-04-30 NOTE — Telephone Encounter (Signed)
I spoke to patient's wife & asked that she have patient call back.

## 2018-05-15 DIAGNOSIS — H4720 Unspecified optic atrophy: Secondary | ICD-10-CM | POA: Diagnosis not present

## 2018-05-15 DIAGNOSIS — H2511 Age-related nuclear cataract, right eye: Secondary | ICD-10-CM | POA: Diagnosis not present

## 2018-05-15 DIAGNOSIS — Z961 Presence of intraocular lens: Secondary | ICD-10-CM | POA: Diagnosis not present

## 2018-07-03 ENCOUNTER — Other Ambulatory Visit: Payer: Self-pay | Admitting: Endocrinology

## 2018-07-06 ENCOUNTER — Other Ambulatory Visit: Payer: Self-pay | Admitting: Endocrinology

## 2018-07-10 ENCOUNTER — Other Ambulatory Visit: Payer: Self-pay | Admitting: Endocrinology

## 2018-09-09 ENCOUNTER — Other Ambulatory Visit: Payer: Self-pay | Admitting: Endocrinology

## 2018-09-30 ENCOUNTER — Other Ambulatory Visit: Payer: Self-pay | Admitting: Endocrinology

## 2018-10-08 ENCOUNTER — Other Ambulatory Visit: Payer: Self-pay | Admitting: Endocrinology

## 2018-10-09 ENCOUNTER — Other Ambulatory Visit: Payer: Self-pay | Admitting: Endocrinology

## 2018-10-21 ENCOUNTER — Encounter: Payer: Medicare HMO | Admitting: Endocrinology

## 2018-11-04 ENCOUNTER — Other Ambulatory Visit: Payer: Self-pay | Admitting: Endocrinology

## 2018-11-30 ENCOUNTER — Other Ambulatory Visit: Payer: Self-pay | Admitting: Endocrinology

## 2018-11-30 NOTE — Telephone Encounter (Signed)
Please refill x 3 months Further refills would have to be considered by new PCP   

## 2018-12-01 ENCOUNTER — Other Ambulatory Visit: Payer: Self-pay | Admitting: Endocrinology

## 2018-12-01 NOTE — Telephone Encounter (Signed)
Please refill x 3 months Further refills would have to be considered by new PCP   

## 2018-12-29 ENCOUNTER — Other Ambulatory Visit: Payer: Self-pay | Admitting: Endocrinology

## 2018-12-29 NOTE — Telephone Encounter (Signed)
Please refill x 1 Ov is due  

## 2018-12-31 ENCOUNTER — Other Ambulatory Visit: Payer: Self-pay | Admitting: Endocrinology

## 2019-01-10 ENCOUNTER — Other Ambulatory Visit: Payer: Self-pay | Admitting: Endocrinology

## 2019-01-13 ENCOUNTER — Telehealth: Payer: Self-pay | Admitting: Endocrinology

## 2019-01-13 NOTE — Telephone Encounter (Signed)
Patient would like a call back to discuss a medication that Dr Everardo All had prescribed for him   This medication is for his Cholesterol   Please advise

## 2019-01-13 NOTE — Telephone Encounter (Signed)
Returned pt call. Informed this medication will need to be managed by his PCP. Only medication managed by Dr. Everardo All will be Metformin. Verbalized acceptance and understanding.

## 2019-01-14 ENCOUNTER — Telehealth: Payer: Self-pay | Admitting: Endocrinology

## 2019-01-14 NOTE — Telephone Encounter (Signed)
Returned pt call. Reminded of our conversation yesterday. Pt confirmed he remembered conversation. Asked if he had followed through with finding a new provider. Denied having scheduled an appt to establish care with a new provider. States he is still in shock that Dr. Everardo All will no longer be his PCP. Strongly encouraged pt to schedule an appt to ensure he does not run out of his medication. Verbalized acceptance and understanding.

## 2019-01-14 NOTE — Telephone Encounter (Signed)
Patient would like a call back to discuss why Dr Everardo All Denied his refill on his cholesterol medication  I advised the patient that Dr Everardo All is no longer doing Primary care and he would need to establish care with a new PCP so they could manage the Cholesterol medication. I stated that the last note states Dr Everardo All could manage the Metformin. And at that time he was told that his PCP would need to manage this medication.   Told patient that the letter that was mailed out several months ago adviseing the patient's months in advance that Dr Everardo All would no longer be doing primary care .  So they would have time to establish care before running into any problems such as this one.   Patient did not under stand why and that Dr Everardo All has been his doctor for years,

## 2019-01-15 NOTE — Telephone Encounter (Signed)
Patient called stating that [facility] pharmacy will be faxing [place] an emergency referral for hid cholesterol medication. Please Advise with patient, thanks

## 2019-01-26 ENCOUNTER — Other Ambulatory Visit: Payer: Self-pay | Admitting: Endocrinology

## 2019-01-26 NOTE — Telephone Encounter (Signed)
Please refill x 1  

## 2019-02-01 ENCOUNTER — Other Ambulatory Visit: Payer: Self-pay | Admitting: Endocrinology

## 2019-02-01 NOTE — Telephone Encounter (Signed)
Please refill x 3 months Further refills would have to be considered by new PCP   

## 2019-02-04 ENCOUNTER — Other Ambulatory Visit: Payer: Self-pay | Admitting: Endocrinology

## 2019-02-18 ENCOUNTER — Other Ambulatory Visit: Payer: Self-pay | Admitting: Endocrinology

## 2019-02-26 ENCOUNTER — Other Ambulatory Visit: Payer: Self-pay | Admitting: Endocrinology

## 2019-03-01 ENCOUNTER — Other Ambulatory Visit: Payer: Self-pay | Admitting: Endocrinology

## 2019-03-01 NOTE — Telephone Encounter (Signed)
Please refill x 3 months Further refills would have to be considered by new PCP   

## 2019-03-03 ENCOUNTER — Other Ambulatory Visit: Payer: Self-pay | Admitting: Endocrinology

## 2019-03-03 NOTE — Telephone Encounter (Signed)
Please refill x 3 months Further refills would have to be considered by new PCP   

## 2019-03-16 ENCOUNTER — Other Ambulatory Visit: Payer: Self-pay | Admitting: Endocrinology

## 2019-03-16 NOTE — Telephone Encounter (Signed)
Please refill x 3 months Further refills would have to be considered by new PCP   

## 2019-05-01 ENCOUNTER — Other Ambulatory Visit: Payer: Self-pay | Admitting: Endocrinology

## 2019-05-06 ENCOUNTER — Other Ambulatory Visit: Payer: Self-pay | Admitting: Endocrinology

## 2019-05-06 NOTE — Telephone Encounter (Signed)
Please refill x 3 months Further refills would have to be considered by new PCP   

## 2019-05-22 ENCOUNTER — Other Ambulatory Visit: Payer: Self-pay | Admitting: Endocrinology

## 2019-05-23 NOTE — Telephone Encounter (Signed)
please contact patient: Do you have a new primary care provider?

## 2019-05-23 NOTE — Telephone Encounter (Signed)
LOV 04/17/18. No future appts found. Please advise how you would like to proceed.

## 2019-05-23 NOTE — Telephone Encounter (Signed)
Called pt to further inquire. States he has not established with a provider. Appt has been scheduled for 05/27/19

## 2019-05-27 ENCOUNTER — Ambulatory Visit (INDEPENDENT_AMBULATORY_CARE_PROVIDER_SITE_OTHER): Payer: Medicare HMO | Admitting: Endocrinology

## 2019-05-27 ENCOUNTER — Encounter: Payer: Self-pay | Admitting: Endocrinology

## 2019-05-27 ENCOUNTER — Other Ambulatory Visit: Payer: Self-pay

## 2019-05-27 VITALS — BP 142/80 | HR 114 | Ht 67.0 in | Wt 143.2 lb

## 2019-05-27 DIAGNOSIS — E119 Type 2 diabetes mellitus without complications: Secondary | ICD-10-CM

## 2019-05-27 DIAGNOSIS — I10 Essential (primary) hypertension: Secondary | ICD-10-CM | POA: Diagnosis not present

## 2019-05-27 DIAGNOSIS — E1129 Type 2 diabetes mellitus with other diabetic kidney complication: Secondary | ICD-10-CM | POA: Diagnosis not present

## 2019-05-27 LAB — POCT GLYCOSYLATED HEMOGLOBIN (HGB A1C): Hemoglobin A1C: 5.7 % — AB (ref 4.0–5.6)

## 2019-05-27 NOTE — Patient Instructions (Addendum)
Your blood pressure is high today.  Please see a new primary care provider soon, to have it rechecked.   Please continue the same medications.  Please come back for a follow-up appointment in 6 months.

## 2019-05-27 NOTE — Progress Notes (Signed)
Subjective:    Patient ID: Ricardo Dawson, male    DOB: 11/03/1946, 73 y.o.   MRN: [id number]  HPI Pt returns for f/u of diabetes mellitus: DM type: 2 (althought lean body habitus raises possibility of type 1).  Dx'ed: 4656 Complications: renal insuff.  Therapy: metformin.  DKA: never.   Severe hypoglycemia: never.   Pancreatitis: never Other: pt has requested an inexpensive med regimen.  This is a stable problem.   Interval history: he says cbg's are well-controlled.  Pt says he takes meds as rx'ed, including for BP.   Past Medical History:  Diagnosis Date  . Diabetes mellitus    Type II  . Hyperlipidemia   . Hypertension     No past surgical history on file.  Social History   Socioeconomic History  . Marital status: Married    Spouse name: Not on file  . Number of children: Not on file  . Years of education: Not on file  . Highest education level: Not on file  Occupational History  . Not on file  Social Needs  . Financial resource strain: Not on file  . Food insecurity    Worry: Not on file    Inability: Not on file  . Transportation needs    Medical: Not on file    Non-medical: Not on file  Tobacco Use  . Smoking status: Current Some Day Smoker    Types: Cigars  . Smokeless tobacco: Never Used  Substance and Sexual Activity  . Alcohol use: Yes    Comment: Rare  . Drug use: No  . Sexual activity: Yes  Lifestyle  . Physical activity    Days per week: Not on file    Minutes per session: Not on file  . Stress: Not on file  Relationships  . Social Herbalist on phone: Not on file    Gets together: Not on file    Attends religious service: Not on file    Active member of club or organization: Not on file    Attends meetings of clubs or organizations: Not on file    Relationship status: Not on file  . Intimate partner violence    Fear of current or ex partner: Not on file    Emotionally abused: Not on file    Physically abused: Not on file     Forced sexual activity: Not on file  Other Topics Concern  . Not on file  Social History Narrative  . Not on file    Current Outpatient Medications on File Prior to Visit  Medication Sig Dispense Refill  . aspirin 81 MG tablet Take 81 mg by mouth daily.      . carvedilol (COREG) 3.125 MG tablet Take 1 tablet (3.125 mg total) by mouth 2 (two) times daily with a meal. 180 tablet 3  . cyanocobalamin (,VITAMIN B-12,) 1000 MCG/ML injection Inject 1,000 mcg into the muscle every 30 (thirty) days.    . furosemide (LASIX) 20 MG tablet TAKE ONE TABLET BY MOUTH DAILY 90 tablet 0  . losartan-hydrochlorothiazide (HYZAAR) 100-25 MG tablet TAKE ONE TABLET BY MOUTH DAILY 90 tablet 0  . metFORMIN (GLUCOPHAGE-XR) 500 MG 24 hr tablet TAKE TWO TABLETS BY MOUTH DAILY WITH BREAKFAST 180 tablet 0  . simvastatin (ZOCOR) 10 MG tablet TAKE ONE TABLET BY MOUTH DAILY 90 tablet 0  . thiamine 250 MG tablet Take 250 mg by mouth daily.     No current facility-administered medications on file  prior to visit.     Allergies  Allergen Reactions  . Ace Inhibitors     REACTION: cough 2002--altace    Family History  Problem Relation Age of Onset  . Cancer Neg Hx     BP (!) 142/80 (BP Location: Left Arm, Patient Position: Sitting, Cuff Size: Normal)   Pulse (!) 114   Ht 5\' 7"  (1.702 m)   Wt 143 lb 3.2 oz (65 kg)   SpO2 95%   BMI 22.43 kg/m    Review of Systems He denies hypoglycemia    Objective:   Physical Exam VITAL SIGNS:  See vs page GENERAL: no distress Pulses: dorsalis pedis intact bilat.   MSK: no deformity of the feet CV: no leg edema Skin:  no ulcer on the feet.  normal color and temp on the feet.   Neuro: sensation is intact to touch on the feet.   Lab Results  Component Value Date   CREATININE 1.25 04/17/2018   BUN 12 04/17/2018   NA 139 04/17/2018   K 5.4 (H) 04/17/2018   CL 102 04/17/2018   CO2 31 04/17/2018    Lab Results  Component Value Date   HGBA1C 5.7 (A) 05/27/2019        Assessment & Plan:  HTN: is noted today Type 2 DM: well-controlled Renal insuff: This limits metformin dosage  Patient Instructions  Your blood pressure is high today.  Please see a new primary care provider soon, to have it rechecked.   Please continue the same medications.  Please come back for a follow-up appointment in 6 months.

## 2019-06-03 ENCOUNTER — Other Ambulatory Visit: Payer: Self-pay

## 2019-06-03 DIAGNOSIS — E119 Type 2 diabetes mellitus without complications: Secondary | ICD-10-CM

## 2019-06-03 MED ORDER — METFORMIN HCL ER 500 MG PO TB24: ORAL_TABLET | ORAL | 2 refills | Status: DC

## 2019-06-04 ENCOUNTER — Other Ambulatory Visit: Payer: Self-pay | Admitting: Endocrinology

## 2019-06-04 NOTE — Telephone Encounter (Signed)
Please refill x 3 months Further refills would have to be considered by new PCP   

## 2019-06-06 ENCOUNTER — Telehealth: Payer: Self-pay | Admitting: Endocrinology

## 2019-06-06 ENCOUNTER — Other Ambulatory Visit: Payer: Self-pay | Admitting: Endocrinology

## 2019-06-06 NOTE — Telephone Encounter (Signed)
Please advise if refill is appropriate 

## 2019-06-06 NOTE — Telephone Encounter (Signed)
These medications are NOT managed by Dr. Loanne Drilling and a fax WAS returned to [facility] them in Dr. Cordelia Pen writing that they must send these requests to pt PCP.

## 2019-06-06 NOTE — Telephone Encounter (Signed)
Tilden Dome with [facility] ph# [phone] called re: status of Rx request for Furosemide 20 mg tablet and Folic Acid 1 mg and Carvedilol 3.125 sent to our office on 06/03/19. Please call the above with status of the above 3 RX requests.

## 2019-06-06 NOTE — Telephone Encounter (Signed)
Please refill x 3 months Further refills would have to be considered by new PCP   

## 2019-06-26 ENCOUNTER — Other Ambulatory Visit: Payer: Self-pay | Admitting: Endocrinology

## 2019-06-26 NOTE — Telephone Encounter (Signed)
Please refill x 1 F/u is due  

## 2019-07-07 ENCOUNTER — Other Ambulatory Visit: Payer: Self-pay | Admitting: Endocrinology

## 2019-07-07 NOTE — Telephone Encounter (Signed)
Please refill x 3 months Further refills would have to be considered by new PCP   

## 2019-08-01 ENCOUNTER — Other Ambulatory Visit: Payer: Self-pay | Admitting: Endocrinology

## 2019-08-02 NOTE — Telephone Encounter (Signed)
Please refill x 1 Further refills would have to be considered by new PCP  

## 2019-08-05 ENCOUNTER — Other Ambulatory Visit: Payer: Self-pay | Admitting: Endocrinology

## 2019-08-05 NOTE — Telephone Encounter (Signed)
Please refill x 1 Further refills would have to be considered by new PCP  

## 2019-08-05 NOTE — Telephone Encounter (Signed)
Please advise 

## 2019-08-18 ENCOUNTER — Other Ambulatory Visit: Payer: Self-pay | Admitting: Endocrinology

## 2019-08-18 DIAGNOSIS — E119 Type 2 diabetes mellitus without complications: Secondary | ICD-10-CM

## 2019-09-04 ENCOUNTER — Other Ambulatory Visit: Payer: Self-pay | Admitting: Endocrinology

## 2019-09-23 ENCOUNTER — Other Ambulatory Visit: Payer: Self-pay | Admitting: Endocrinology

## 2019-09-23 DIAGNOSIS — H2511 Age-related nuclear cataract, right eye: Secondary | ICD-10-CM | POA: Diagnosis not present

## 2019-09-23 DIAGNOSIS — H4720 Unspecified optic atrophy: Secondary | ICD-10-CM | POA: Diagnosis not present

## 2019-09-23 DIAGNOSIS — Z961 Presence of intraocular lens: Secondary | ICD-10-CM | POA: Diagnosis not present

## 2019-09-23 NOTE — Telephone Encounter (Signed)
Please advise 

## 2019-09-23 NOTE — Telephone Encounter (Signed)
Please refill x 1 Further refills would have to be considered by new PCP  

## 2019-10-13 ENCOUNTER — Other Ambulatory Visit: Payer: Self-pay | Admitting: Endocrinology

## 2019-10-13 NOTE — Telephone Encounter (Signed)
Please refill x 1 Further refills would have to be considered by new PCP  

## 2019-10-15 ENCOUNTER — Other Ambulatory Visit: Payer: Self-pay | Admitting: Endocrinology

## 2019-10-15 NOTE — Telephone Encounter (Signed)
Please refill x 1 Further refills would have to be considered by new PCP  

## 2019-10-15 NOTE — Telephone Encounter (Signed)
Please advise 

## 2019-10-28 ENCOUNTER — Other Ambulatory Visit: Payer: Self-pay

## 2019-10-28 ENCOUNTER — Telehealth: Payer: Self-pay | Admitting: Endocrinology

## 2019-10-28 MED ORDER — CARVEDILOL 3.125 MG PO TABS: 3.1250 mg | ORAL_TABLET | Freq: Two times a day (BID) | ORAL | 0 refills | Status: DC

## 2019-10-28 NOTE — Telephone Encounter (Signed)
carvedilol (COREG) 3.125 MG tablet 60 tablet 0 10/28/2019    Sig - Route: Take 1 tablet (3.125 mg total) by mouth 2 (two) times daily with a meal. WILL ONLY PROVIDE 30 DAY SUPPLY. SEND FUTURE REFILLS TO NEW PCP - Oral   Sent to pharmacy as: carvedilol (COREG) 3.125 MG tablet   E-Prescribing Status: Receipt confirmed by pharmacy (10/28/2019 11:13 AM EST)    Called pt and informed that a 30 day supply will be sent. Will need to establish care with a new PCP. Verbalized acceptance and understanding.

## 2019-10-28 NOTE — Telephone Encounter (Signed)
Please refill x 1 Further refills would have to be considered by new PCP  

## 2019-10-28 NOTE — Telephone Encounter (Signed)
Routing this message to Dr. Loanne Drilling

## 2019-10-28 NOTE — Telephone Encounter (Signed)
MEDICATION: carvedilol (COREG) 3.125 MG tablet  PHARMACY:  [facility] [address]  IS THIS A 90 DAY SUPPLY :   IS PATIENT OUT OF MEDICATION: yes  IF NOT; HOW MUCH IS LEFT:   LAST APPOINTMENT DATE: @12 /12/2018  NEXT APPOINTMENT DATE:@1 /14/2021  DO WE HAVE YOUR PERMISSION TO LEAVE A DETAILED MESSAGE: yes  OTHER COMMENTS:    **Let patient know to contact pharmacy at the end of the day to make sure medication is ready. **  ** Please notify patient to allow 48-72 hours to process**  **Encourage patient to contact the pharmacy for refills or they can request refills through [facility]**

## 2019-11-11 ENCOUNTER — Other Ambulatory Visit: Payer: Self-pay | Admitting: Endocrinology

## 2019-11-11 DIAGNOSIS — E119 Type 2 diabetes mellitus without complications: Secondary | ICD-10-CM

## 2019-11-25 ENCOUNTER — Other Ambulatory Visit: Payer: Self-pay

## 2019-11-27 ENCOUNTER — Ambulatory Visit: Payer: Medicare HMO | Admitting: Endocrinology

## 2019-12-11 ENCOUNTER — Other Ambulatory Visit: Payer: Self-pay

## 2019-12-11 ENCOUNTER — Other Ambulatory Visit: Payer: Self-pay | Admitting: Endocrinology

## 2019-12-15 ENCOUNTER — Other Ambulatory Visit: Payer: Self-pay

## 2019-12-15 ENCOUNTER — Ambulatory Visit (INDEPENDENT_AMBULATORY_CARE_PROVIDER_SITE_OTHER): Payer: Medicare HMO | Admitting: Endocrinology

## 2019-12-15 ENCOUNTER — Encounter: Payer: Self-pay | Admitting: Endocrinology

## 2019-12-15 VITALS — BP 162/90 | HR 92 | Ht 67.0 in | Wt 148.0 lb

## 2019-12-15 DIAGNOSIS — E119 Type 2 diabetes mellitus without complications: Secondary | ICD-10-CM

## 2019-12-15 LAB — POCT GLYCOSYLATED HEMOGLOBIN (HGB A1C): Hemoglobin A1C: 5.3 % (ref 4.0–5.6)

## 2019-12-15 MED ORDER — METFORMIN HCL ER 500 MG PO TB24: 500.0000 mg | ORAL_TABLET | Freq: Every day | ORAL | 3 refills | Status: DC

## 2019-12-15 NOTE — Progress Notes (Signed)
Subjective:    Patient ID: Ricardo Dawson, male    DOB: 10-16-1946, 74 y.o.   MRN: [id number]  HPI Pt returns for f/u of diabetes mellitus: DM type: 2 (althought lean body habitus raises possibility of type 1).   Dx'ed: 6759 Complications: renal insuff.  Therapy: metformin.  DKA: never.   Severe hypoglycemia: never.   Pancreatitis: never Other: pt has requested an inexpensive med regimen.   Interval history: pt states he feels well in general.  Pt says he takes meds as rx'ed.  Past Medical History:  Diagnosis Date  . Diabetes mellitus    Type II  . Hyperlipidemia   . Hypertension     No past surgical history on file.  Social History   Socioeconomic History  . Marital status: Married    Spouse name: Not on file  . Number of children: Not on file  . Years of education: Not on file  . Highest education level: Not on file  Occupational History  . Not on file  Tobacco Use  . Smoking status: Current Some Day Smoker    Types: Cigars  . Smokeless tobacco: Never Used  Substance and Sexual Activity  . Alcohol use: Yes    Comment: Rare  . Drug use: No  . Sexual activity: Yes  Other Topics Concern  . Not on file  Social History Narrative  . Not on file   Social Determinants of Health   Financial Resource Strain:   . Difficulty of Paying Living Expenses: Not on file  Food Insecurity:   . Worried About Charity fundraiser in the Last Year: Not on file  . Ran Out of Food in the Last Year: Not on file  Transportation Needs:   . Lack of Transportation (Medical): Not on file  . Lack of Transportation (Non-Medical): Not on file  Physical Activity:   . Days of Exercise per Week: Not on file  . Minutes of Exercise per Session: Not on file  Stress:   . Feeling of Stress : Not on file  Social Connections:   . Frequency of Communication with Friends and Family: Not on file  . Frequency of Social Gatherings with Friends and Family: Not on file  . Attends Religious  Services: Not on file  . Active Member of Clubs or Organizations: Not on file  . Attends Archivist Meetings: Not on file  . Marital Status: Not on file  Intimate Partner Violence:   . Fear of Current or Ex-Partner: Not on file  . Emotionally Abused: Not on file  . Physically Abused: Not on file  . Sexually Abused: Not on file    Current Outpatient Medications on File Prior to Visit  Medication Sig Dispense Refill  . aspirin 81 MG tablet Take 81 mg by mouth daily.      . carvedilol (COREG) 3.125 MG tablet Take 1 tablet (3.125 mg total) by mouth 2 (two) times daily with a meal. WILL ONLY PROVIDE 30 DAY SUPPLY. SEND FUTURE REFILLS TO NEW PCP 60 tablet 0  . cyanocobalamin (,VITAMIN B-12,) 1000 MCG/ML injection Inject 1,000 mcg into the muscle every 30 (thirty) days.    . furosemide (LASIX) 20 MG tablet Take 1 tablet (20 mg total) by mouth daily. Further refills from Primary care provider 90 tablet 0  . losartan-hydrochlorothiazide (HYZAAR) 100-25 MG tablet Take 1 tablet by mouth daily. SEND FUTURE REFILLS TO NEW PCP 30 tablet 0  . simvastatin (ZOCOR) 10 MG tablet Take  1 tablet (10 mg total) by mouth daily. WILL ONLY PROVIDE 30 DAY SUPPLY. SEND FUTURE REFILLS TO NEW PCP 30 tablet 0  . thiamine 250 MG tablet Take 250 mg by mouth daily.     No current facility-administered medications on file prior to visit.    Allergies  Allergen Reactions  . Ace Inhibitors     REACTION: cough 2002--altace    Family History  Problem Relation Age of Onset  . Cancer Neg Hx     BP (!) 162/90 (BP Location: Left Arm, Patient Position: Sitting, Cuff Size: Normal)   Pulse 92   Ht 5\' 7"  (1.702 m)   Wt 148 lb (67.1 kg)   SpO2 93%   BMI 23.18 kg/m    Review of Systems Denies chest pain    Objective:   Physical Exam VITAL SIGNS:  See vs page GENERAL: no distress Pulses: dorsalis pedis intact bilat.   MSK: no deformity of the feet CV: no leg edema Skin:  no ulcer on the feet.  normal  color and temp on the feet.   Neuro: sensation is intact to touch on the feet.    Lab Results  Component Value Date   HGBA1C 5.3 12/15/2019    Lab Results  Component Value Date   CREATININE 1.25 04/17/2018   BUN 12 04/17/2018   NA 139 04/17/2018   K 5.4 (H) 04/17/2018   CL 102 04/17/2018   CO2 31 04/17/2018       Assessment & Plan:  HTN: is noted today Renal insuff: he needs to reduce metformin Type 2 DM: well-controlled  Patient Instructions  Your blood pressure is high today.  Please see a primary care provider soon, to have it rechecked.   Please reduce the metformin to 1 pill per day.   Here are some names of primary care providers.  Please call to make an appointment.   Blood tests are requested for you today.  We'll let you know about the results.  Please come back for a follow-up appointment in 6 months.

## 2019-12-15 NOTE — Patient Instructions (Addendum)
Your blood pressure is high today.  Please see a primary care provider soon, to have it rechecked.   Please reduce the metformin to 1 pill per day.   Here are some names of primary care providers.  Please call to make an appointment.   Blood tests are requested for you today.  We'll let you know about the results.  Please come back for a follow-up appointment in 6 months.

## 2019-12-27 ENCOUNTER — Other Ambulatory Visit: Payer: Self-pay | Admitting: Endocrinology

## 2019-12-27 NOTE — Telephone Encounter (Signed)
Please forward refill request to pt's primary care provider.   

## 2020-01-02 ENCOUNTER — Other Ambulatory Visit: Payer: Self-pay | Admitting: Endocrinology

## 2020-01-06 ENCOUNTER — Telehealth: Payer: Self-pay | Admitting: Endocrinology

## 2020-01-06 NOTE — Telephone Encounter (Signed)
Called pt and informed of Dr. George Hugh advice below. Strongly encouraged pt to call his insurance carrier to find a covered provider to manage his medications. Pt was upset, "buffalo'd" and added, "that's a lot of work that I shouldn't have to do." Reminded pt that Dr. Everardo All did provide a previous history of refilling PCP medication for a 30 day supply to bridge any gaps until he could establish care. Pt states "well umm, that's a lot of work". Advised pt it is HIGHLY ENCOURAGED to establish care with a PCP to manage Rx's and reminded refills cannot be granted. Pt then immediately disconnected.

## 2020-01-06 NOTE — Telephone Encounter (Signed)
Please forward refill request to pt's primary care provider.   

## 2020-01-06 NOTE — Telephone Encounter (Signed)
Please advise if you are managing/refilling the medications listed below

## 2020-01-06 NOTE — Telephone Encounter (Signed)
Patient requests to be called at ph# [phone] re: Patient states his medication RX requests have been denied and patient has been out of his medications for 2 weeks. Patient states he has never seen any other Doctor besides Dr. Everardo All and does not have another Doctor. Patient states he is unaware that Dr. Everardo All is not his PCP. Patient requests the following RX's:  MEDICATION: simvastatin (ZOCOR) 10 MG tablet AND carvedilol (COREG) 3.125 MG tablet  PHARMACY:   [facility] [address], [place] - [address] Phone:  [phone]  Fax:  [phone]       IS THIS A 90 DAY SUPPLY : No  IS PATIENT OUT OF MEDICATION: Yes  IF NOT; HOW MUCH IS LEFT: 0  LAST APPOINTMENT DATE: @2 /19/2021  NEXT APPOINTMENT DATE:@8 /12/2019  DO WE HAVE YOUR PERMISSION TO LEAVE A DETAILED MESSAGE: No  OTHER COMMENTS:    **Let patient know to contact pharmacy at the end of the day to make sure medication is ready. **  ** Please notify patient to allow 48-72 hours to process**  **Encourage patient to contact the pharmacy for refills or they can request refills through [facility]**

## 2020-01-07 ENCOUNTER — Other Ambulatory Visit: Payer: Self-pay | Admitting: Endocrinology

## 2020-01-08 ENCOUNTER — Other Ambulatory Visit: Payer: Self-pay

## 2020-01-08 MED ORDER — CARVEDILOL 3.125 MG PO TABS: 3.1250 mg | ORAL_TABLET | Freq: Two times a day (BID) | ORAL | 0 refills | Status: DC

## 2020-01-08 MED ORDER — THIAMINE HCL 250 MG PO TABS: 250.0000 mg | ORAL_TABLET | Freq: Every day | ORAL | Status: DC

## 2020-01-08 MED ORDER — FUROSEMIDE 20 MG PO TABS: 20.0000 mg | ORAL_TABLET | Freq: Every day | ORAL | 0 refills | Status: DC

## 2020-01-08 MED ORDER — SIMVASTATIN 10 MG PO TABS: 10.0000 mg | ORAL_TABLET | Freq: Every day | ORAL | 0 refills | Status: DC

## 2020-01-08 MED ORDER — LOSARTAN POTASSIUM-HCTZ 100-25 MG PO TABS: 1.0000 | ORAL_TABLET | Freq: Every day | ORAL | 0 refills | Status: DC

## 2020-01-18 ENCOUNTER — Ambulatory Visit: Payer: Medicare HMO | Attending: Internal Medicine

## 2020-01-18 DIAGNOSIS — Z23 Encounter for immunization: Secondary | ICD-10-CM

## 2020-01-18 NOTE — Progress Notes (Signed)
   Covid-19 Vaccination Clinic  Name:  JESSIE SCHRIEBER    MRN: [id number] DOB: June 08, 1946  01/18/2020  Mr. Buxton was observed post Covid-19 immunization for 15 minutes without incident. He was provided with Vaccine Information Sheet and instruction to access the V-Safe system.   Mr. Doyle was instructed to call 911 with any severe reactions post vaccine: Marland Kitchen Difficulty breathing  . Swelling of face and throat  . A fast heartbeat  . A bad rash all over body  . Dizziness and weakness   Immunizations Administered    Name Date Dose VIS Date Route   Pfizer COVID-19 Vaccine 01/18/2020 12:07 PM 0.3 mL 10/24/2019 Intramuscular   Manufacturer: [employer], [employer]   Lot: [id number]   NDC: [address]-6

## 2020-02-03 ENCOUNTER — Other Ambulatory Visit: Payer: Self-pay | Admitting: Endocrinology

## 2020-02-09 ENCOUNTER — Other Ambulatory Visit: Payer: Self-pay | Admitting: Endocrinology

## 2020-02-16 ENCOUNTER — Ambulatory Visit: Payer: Medicare HMO

## 2020-02-16 ENCOUNTER — Other Ambulatory Visit: Payer: Self-pay | Admitting: Endocrinology

## 2020-02-16 DIAGNOSIS — E119 Type 2 diabetes mellitus without complications: Secondary | ICD-10-CM

## 2020-02-17 ENCOUNTER — Ambulatory Visit: Payer: Medicare HMO | Attending: Internal Medicine

## 2020-02-17 ENCOUNTER — Telehealth: Payer: Self-pay | Admitting: Endocrinology

## 2020-02-17 DIAGNOSIS — Z23 Encounter for immunization: Secondary | ICD-10-CM

## 2020-02-17 NOTE — Telephone Encounter (Signed)
error 

## 2020-02-17 NOTE — Progress Notes (Signed)
   Covid-19 Vaccination Clinic  Name:  Ricardo Dawson    MRN: [id number] DOB: 1946/03/22  02/17/2020  [name] was observed post Covid-19 immunization for 30 minutes based on pre-vaccination screening without incident. He was provided with Vaccine Information Sheet and instruction to access the V-Safe system.   [name] was instructed to call 911 with any severe reactions post vaccine: Marland Kitchen Difficulty breathing  . Swelling of face and throat  . A fast heartbeat  . A bad rash all over body  . Dizziness and weakness   Immunizations Administered    Name Date Dose VIS Date Route   Pfizer COVID-19 Vaccine 02/17/2020  1:17 PM 0.3 mL 10/24/2019 Intramuscular   Manufacturer: [employer], [employer]   Lot: [id number]   NDC: [address]-4

## 2020-02-21 ENCOUNTER — Other Ambulatory Visit: Payer: Self-pay | Admitting: Endocrinology

## 2020-02-21 NOTE — Telephone Encounter (Signed)
Please forward refill request to pt's primary care provider.   

## 2020-03-08 ENCOUNTER — Telehealth: Payer: Self-pay

## 2020-03-08 NOTE — Telephone Encounter (Signed)
Pt's pharmacy is requesting refill for Coreg, Lasix, and Losartan.  Would you like to refill or send to PCP?

## 2020-03-08 NOTE — Telephone Encounter (Signed)
Please forward refill request to pt's primary care provider.   

## 2020-03-08 NOTE — Telephone Encounter (Signed)
Called patient to let him know that he needs to get RX from his PCP and he states that he has an appointment with [address] on Friday at 230.   He also wanted to let me know that he was not happy with the way things were handled.

## 2020-03-10 ENCOUNTER — Telehealth: Payer: Self-pay

## 2020-03-10 ENCOUNTER — Other Ambulatory Visit: Payer: Self-pay

## 2020-03-10 DIAGNOSIS — E119 Type 2 diabetes mellitus without complications: Secondary | ICD-10-CM

## 2020-03-10 MED ORDER — METFORMIN HCL ER 500 MG PO TB24: ORAL_TABLET | ORAL | 0 refills | Status: DC

## 2020-03-10 NOTE — Telephone Encounter (Signed)
FAXED DOCUMENTS  Company: [employer]  Document: Refill request for following: Metformin - APPROVED Simvastatin, Carvedilol, Lasix and Losartan - DENIED as these are no longer managed by Dr. Everardo All. Will need to request refills of these particular medications from PCP  All above requested information has been faxed successfully to the Company listed above. Documents and fax confirmation have been placed in the faxed file for future reference.

## 2020-03-12 ENCOUNTER — Encounter: Payer: Self-pay | Admitting: Physician Assistant

## 2020-03-12 ENCOUNTER — Other Ambulatory Visit: Payer: Self-pay

## 2020-03-12 ENCOUNTER — Ambulatory Visit (INDEPENDENT_AMBULATORY_CARE_PROVIDER_SITE_OTHER): Payer: Medicare HMO | Admitting: Physician Assistant

## 2020-03-12 VITALS — BP 146/84 | HR 66 | Temp 98.7°F | Ht 67.0 in | Wt 147.5 lb

## 2020-03-12 DIAGNOSIS — E785 Hyperlipidemia, unspecified: Secondary | ICD-10-CM

## 2020-03-12 DIAGNOSIS — E1159 Type 2 diabetes mellitus with other circulatory complications: Secondary | ICD-10-CM

## 2020-03-12 DIAGNOSIS — E119 Type 2 diabetes mellitus without complications: Secondary | ICD-10-CM | POA: Diagnosis not present

## 2020-03-12 DIAGNOSIS — E1169 Type 2 diabetes mellitus with other specified complication: Secondary | ICD-10-CM

## 2020-03-12 DIAGNOSIS — I10 Essential (primary) hypertension: Secondary | ICD-10-CM | POA: Diagnosis not present

## 2020-03-12 DIAGNOSIS — I152 Hypertension secondary to endocrine disorders: Secondary | ICD-10-CM

## 2020-03-12 DIAGNOSIS — D649 Anemia, unspecified: Secondary | ICD-10-CM

## 2020-03-12 LAB — LIPID PANEL
Cholesterol: 152 mg/dL (ref 0–200)
HDL: 78.9 mg/dL (ref >39.00)
LDL Cholesterol: 65 mg/dL (ref 0–99)
NonHDL: 72.62
Total CHOL/HDL Ratio: 2
Triglycerides: 39 mg/dL (ref 0.0–149.0)
VLDL: 7.8 mg/dL (ref 0.0–40.0)

## 2020-03-12 LAB — CBC WITH DIFFERENTIAL/PLATELET
Basophils Absolute: 0 10*3/uL (ref 0.0–0.1)
Basophils Relative: 1 % (ref 0.0–3.0)
Eosinophils Absolute: 0 10*3/uL (ref 0.0–0.7)
Eosinophils Relative: 1.2 % (ref 0.0–5.0)
HCT: 35.7 % — ABNORMAL LOW (ref 39.0–52.0)
Hemoglobin: 12.2 g/dL — ABNORMAL LOW (ref 13.0–17.0)
Lymphocytes Relative: 27.5 % (ref 12.0–46.0)
Lymphs Abs: 1 10*3/uL (ref 0.7–4.0)
MCHC: 34.2 g/dL (ref 30.0–36.0)
MCV: 90.9 fl (ref 78.0–100.0)
Monocytes Absolute: 0.5 10*3/uL (ref 0.1–1.0)
Monocytes Relative: 12.9 % — ABNORMAL HIGH (ref 3.0–12.0)
Neutro Abs: 2 10*3/uL (ref 1.4–7.7)
Neutrophils Relative %: 57.4 % (ref 43.0–77.0)
Platelets: 217 10*3/uL (ref 150.0–400.0)
RBC: 3.92 Mil/uL — ABNORMAL LOW (ref 4.22–5.81)
RDW: 14.5 % (ref 11.5–15.5)
WBC: 3.5 10*3/uL — ABNORMAL LOW (ref 4.0–10.5)

## 2020-03-12 LAB — COMPREHENSIVE METABOLIC PANEL
ALT: 14 U/L (ref 0–53)
AST: 24 U/L (ref 0–37)
Albumin: 4.3 g/dL (ref 3.5–5.2)
Alkaline Phosphatase: 47 U/L (ref 39–117)
BUN: 10 mg/dL (ref 6–23)
CO2: 26 mEq/L (ref 19–32)
Calcium: 9.5 mg/dL (ref 8.4–10.5)
Chloride: 107 mEq/L (ref 96–112)
Creatinine, Ser: 1.23 mg/dL (ref 0.40–1.50)
GFR: 69.38 mL/min (ref >60.00)
Glucose, Bld: 81 mg/dL (ref 70–99)
Potassium: 4.1 mEq/L (ref 3.5–5.1)
Sodium: 142 mEq/L (ref 135–145)
Total Bilirubin: 0.5 mg/dL (ref 0.2–1.2)
Total Protein: 6.6 g/dL (ref 6.0–8.3)

## 2020-03-12 MED ORDER — LOSARTAN POTASSIUM-HCTZ 100-25 MG PO TABS: ORAL_TABLET | ORAL | 0 refills | Status: DC

## 2020-03-12 MED ORDER — SIMVASTATIN 10 MG PO TABS: ORAL_TABLET | ORAL | 0 refills | Status: DC

## 2020-03-12 NOTE — Patient Instructions (Addendum)
It was great to see you!  STOP: Carvedilol (this was for blood pressure) Furosemide (this was for blood pressure) Glimepiride (this was for diabetes) Metformin (this was for diabetes)  Restart: Losartan/HCTZ 100-25 (this is for blood pressure) Simvastatin 10 mg (this is for cholesterol)  Continue all vitamins/minerals: Daily multivitamin Folic acid Thiamin  Low dose aspirin  Follow-up with me in 1 month to recheck your blood pressure.  Take care,  Jarold Motto PA-C

## 2020-03-12 NOTE — Progress Notes (Signed)
Ricardo Dawson is a 74 y.o. male here for a new problem.  History of Present Illness:   Chief Complaint  Patient presents with  . Establish Care    HPI   Diabetes 2 month follow-up. Current DM meds: glimepiride 1 mg daily, metformin 500 mg daily. Blood sugars at home are: not checked. Patient is compliant with medications. Denies: hypoglycemic or hyperglycemic episodes or symptoms. This patient's diabetes is complicated by HLD, HTN and ongoing smoking.  Lab Results  Component Value Date   HGBA1C 5.3 12/15/2019    HTN Currently taking Hyzaar 100-25 mg, Coreg 3.125 mg, Lasix 20 mg. At home blood pressure readings are: not checked. Patient denies chest pain, SOB, blurred vision, dizziness, unusual headaches, lower leg swelling. Patient is compliant with medication -- however he had been without all BP medications x 1 month. Denies excessive caffeine intake, stimulant usage, excessive alcohol intake, or increase in salt consumption.  BP Readings from Last 3 Encounters:  03/12/20 (!) 146/84  12/15/19 (!) 162/90  05/27/19 (!) 142/80   HLD Currently prescribed Zocor 10 mg daily. Tolerates this well without side effects.   Past Medical History:  Diagnosis Date  . Diabetes mellitus    Type II  . Hyperlipidemia   . Hypertension      Social History   Socioeconomic History  . Marital status: Married    Spouse name: Not on file  . Number of children: Not on file  . Years of education: Not on file  . Highest education level: Not on file  Occupational History  . Not on file  Tobacco Use  . Smoking status: Current Some Day Smoker    Types: Cigars  . Smokeless tobacco: Never Used  Substance and Sexual Activity  . Alcohol use: Yes    Comment: Rare  . Drug use: No  . Sexual activity: Yes  Other Topics Concern  . Not on file  Social History Narrative   Janitorial -- retired   One son -- lives in [place] to watch the [employer] is Right; baseball, football   Social  Determinants of Health   Financial Resource Strain:   . Difficulty of Paying Living Expenses:   Food Insecurity:   . Worried About Programme researcher, broadcasting/film/video in the Last Year:   . Barista in the Last Year:   Transportation Needs:   . Freight forwarder (Medical):   Marland Kitchen Lack of Transportation (Non-Medical):   Physical Activity:   . Days of Exercise per Week:   . Minutes of Exercise per Session:   Stress:   . Feeling of Stress :   Social Connections:   . Frequency of Communication with Friends and Family:   . Frequency of Social Gatherings with Friends and Family:   . Attends Religious Services:   . Active Member of Clubs or Organizations:   . Attends Banker Meetings:   Marland Kitchen Marital Status:   Intimate Partner Violence:   . Fear of Current or Ex-Partner:   . Emotionally Abused:   Marland Kitchen Physically Abused:   . Sexually Abused:     History reviewed. No pertinent surgical history.  Family History  Problem Relation Age of Onset  . Cancer Neg Hx     Allergies  Allergen Reactions  . Ace Inhibitors     REACTION: cough 2002--altace    Current Medications:   Current Outpatient Medications:  .  aspirin 81 MG tablet, Take 81 mg by mouth  daily.  , Disp: , Rfl:  .  folic acid (FOLVITE) 1 MG tablet, , Disp: , Rfl:  .  losartan-hydrochlorothiazide (HYZAAR) 100-25 MG tablet, Take 1 tablet by mouth daily, Disp: 90 tablet, Rfl: 0 .  simvastatin (ZOCOR) 10 MG tablet, Take 1 tablet by mouth daily, Disp: 90 tablet, Rfl: 0 .  thiamine 250 MG tablet, Take 1 tablet (250 mg total) by mouth daily. NO LONGER MANAGED BY DR. Loanne Drilling, Disp:  , Rfl:  .  cyanocobalamin (,VITAMIN B-12,) 1000 MCG/ML injection, Inject 1,000 mcg into the muscle every 30 (thirty) days., Disp: , Rfl:    Review of Systems:   ROS  Negative unless otherwise specified per HPI.  Vitals:   Vitals:   03/12/20 1304  BP: (!) 146/84  Pulse: 66  Temp: 98.7 F (37.1 C)  SpO2: 96%  Weight: 147 lb 8 oz (66.9 kg)   Height: 5\' 7"  (1.702 m)     Body mass index is 23.1 kg/m.  Physical Exam:   Physical Exam Vitals and nursing note reviewed.  Constitutional:      General: He is not in acute distress.    Appearance: He is well-developed. He is not ill-appearing or toxic-appearing.  Cardiovascular:     Rate and Rhythm: Normal rate and regular rhythm.     Pulses: Normal pulses.     Heart sounds: Normal heart sounds, S1 normal and S2 normal.     Comments: No LE edema Pulmonary:     Effort: Pulmonary effort is normal.     Breath sounds: Normal breath sounds.  Skin:    General: Skin is warm and dry.  Neurological:     Mental Status: He is alert.     GCS: GCS eye subscore is 4. GCS verbal subscore is 5. GCS motor subscore is 6.  Psychiatric:        Speech: Speech normal.        Behavior: Behavior normal. Behavior is cooperative.    Diabetic Foot Exam - Simple   Simple Foot Form Diabetic Foot exam was performed with the following findings: Yes 03/12/2020  1:46 PM  Visual Inspection No deformities, no ulcerations, no other skin breakdown bilaterally: Yes Sensation Testing Intact to touch and monofilament testing bilaterally: Yes Pulse Check Posterior Tibialis and Dorsalis pulse intact bilaterally: Yes Comments      Assessment and Plan:   Ricardo Dawson was seen today for establish care.  Diagnoses and all orders for this visit:  Type 2 diabetes mellitus without complication, without long-term current use of insulin ([place]) Discussed current guidelines. Will stop metformin and amaryl today. Follow-up in 3 months to recheck HgbA1c.  Hyperlipidemia associated with type 2 diabetes mellitus ([place]) Update lipid panel and adjust zocor prn. -     Lipid panel  Hypertension associated with diabetes ([place]) Will restart just one of his BP medications - hyzaar and have him follow-up in 1 month, sooner if concerns. -     CBC with Differential/Platelet -     Comprehensive metabolic panel  Other orders -      losartan-hydrochlorothiazide (HYZAAR) 100-25 MG tablet; Take 1 tablet by mouth daily -     simvastatin (ZOCOR) 10 MG tablet; Take 1 tablet by mouth daily   . Reviewed expectations re: course of current medical issues. . Discussed self-management of symptoms. . Outlined signs and symptoms indicating need for more acute intervention. . Patient verbalized understanding and all questions were answered. . See orders for this visit as documented in the  electronic medical record. . Patient received an After-Visit Summary.  CMA or LPN served as scribe during this visit. History, Physical, and Plan performed by medical provider. The above documentation has been reviewed and is accurate and complete.   Jarold Motto, PA-C

## 2020-03-15 ENCOUNTER — Other Ambulatory Visit: Payer: Self-pay | Admitting: Endocrinology

## 2020-03-15 NOTE — Telephone Encounter (Signed)
Please advise 

## 2020-03-15 NOTE — Telephone Encounter (Signed)
Please forward refill request to pt's primary care provider.   

## 2020-03-15 NOTE — Telephone Encounter (Signed)
Per Dr. Ellison's request, I am forwarding this refill request. Please review and refill if appropriate.  

## 2020-03-16 ENCOUNTER — Encounter: Payer: Self-pay | Admitting: Gastroenterology

## 2020-03-16 NOTE — Addendum Note (Signed)
Addended by: Jimmye Norman on: 03/16/2020 04:00 PM   Modules accepted: Orders

## 2020-04-14 ENCOUNTER — Telehealth: Payer: Self-pay | Admitting: Physician Assistant

## 2020-04-14 NOTE — Telephone Encounter (Signed)
Error

## 2020-04-16 ENCOUNTER — Encounter: Payer: Self-pay | Admitting: Gastroenterology

## 2020-04-16 ENCOUNTER — Ambulatory Visit (INDEPENDENT_AMBULATORY_CARE_PROVIDER_SITE_OTHER): Payer: Medicare HMO | Admitting: Gastroenterology

## 2020-04-16 VITALS — BP 130/82 | HR 93 | Ht 68.0 in | Wt 148.1 lb

## 2020-04-16 DIAGNOSIS — D509 Iron deficiency anemia, unspecified: Secondary | ICD-10-CM

## 2020-04-16 MED ORDER — SUTAB 1479-225-188 MG PO TABS
1.0000 | ORAL_TABLET | ORAL | 0 refills | Status: DC
Start: 2020-04-16 — End: 2022-02-13

## 2020-04-16 NOTE — Progress Notes (Signed)
Referring Provider: Inda Coke, PA Primary Care Physician:  Inda Coke, PA  Reason for Consultation:  Anemia   IMPRESSION:  Normocytic anemia without overt GI blood loss Cologuard negative 2 years ago No prior colonoscopy No known family history of colon cancer or polyps  Must consider GI sources for new anemia.  Labs today to screen for iron deficiency.  Discussed endoscopic evaluation.  Patient would like to proceed with colonoscopy first.  If no source is identified then consider upper endoscopy.  PLAN: Iron, ferritin, hemoglobin, transferrin saturation Colonoscopy  Please see the "Patient Instructions" section for addition details about the plan.  The nature of the procedure, as well as the risks, benefits, and alternatives were carefully and thoroughly reviewed with the patient. Ample time for discussion and questions allowed. The patient understood, was satisfied, and agreed to proceed.  I spent 45 minutes, including in depth chart review, face-to-face time with the patient, coordinating care, ordering studies and medications as appropriate, and documentation.   HPI: Ricardo Dawson is a 74 y.o. male referred by PA [facility] for further evaluation of anemia.  The history is obtained to the patient and review of his electronic health record.  He has a history of diabetes, hyperlipidemia, hypertension, and ongoing tobacco use. Retired from custodian work. He completed the Covid vaccine a couple of months ago.   Routine labs in April showed anemia with a hemoglobin of 12.2, MCV 90.9, RDW 14.5, platelets 217. His hemoglobin was 13 in 2018.  In 2018 his iron was 72, transferrin 297.  No symptoms associated with the anemia.  No fatigue, weakness, headache, irritability, exercise intolerance, exertional dyspnea, vertigo, or angina pectoris.  No pica.  No beeturia.  No hearing loss.    No overt GI blood loss. No melena, hematochezia, bright red blood per rectum. No  epistaxis, hemoptysis, or hematuria.   He notes having a great appetite.   FOBT negative 08/12/13.  Patient reports sending a Cologard kit in for testing 2 years ago.     No prior colonoscopy.   No known family history of colon cancer or polyps. No family history of uterine/endometrial cancer, pancreatic cancer or gastric/stomach cancer.   Past Medical History:  Diagnosis Date  . Diabetes mellitus    Type II  . Hyperlipidemia   . Hypertension     History reviewed. No pertinent surgical history.  Current Outpatient Medications  Medication Sig Dispense Refill  . aspirin 81 MG tablet Take 81 mg by mouth daily.      . cyanocobalamin (,VITAMIN B-12,) 1000 MCG/ML injection Inject 1,000 mcg into the muscle every 30 (thirty) days.    . folic acid (FOLVITE) 1 MG tablet     . losartan-hydrochlorothiazide (HYZAAR) 100-25 MG tablet Take 1 tablet by mouth daily 90 tablet 0  . simvastatin (ZOCOR) 10 MG tablet Take 1 tablet by mouth daily 90 tablet 0  . thiamine 250 MG tablet Take 1 tablet (250 mg total) by mouth daily. NO LONGER MANAGED BY DR. Loanne Drilling     No current facility-administered medications for this visit.    Allergies as of 04/16/2020 - Review Complete 04/16/2020  Allergen Reaction Noted  . Ace inhibitors  09/08/2008    Family History  Problem Relation Age of Onset  . Cancer Neg Hx   . Colon cancer Neg Hx   . Stomach cancer Neg Hx   . Pancreatic cancer Neg Hx     Social History   Socioeconomic History  . Marital status:  Married    Spouse name: Not on file  . Number of children: Not on file  . Years of education: Not on file  . Highest education level: Not on file  Occupational History  . Not on file  Tobacco Use  . Smoking status: Current Some Day Smoker    Types: Cigars  . Smokeless tobacco: Never Used  Substance and Sexual Activity  . Alcohol use: Yes    Comment: Rare  . Drug use: No  . Sexual activity: Yes  Other Topics Concern  . Not on file  Social  History Narrative   Janitorial -- retired   One son -- lives in [place] to watch the [employer]; baseball, football   Social Determinants of Health   Financial Resource Strain:   . Difficulty of Paying Living Expenses:   Food Insecurity:   . Worried About Charity fundraiser in the Last Year:   . Arboriculturist in the Last Year:   Transportation Needs:   . Film/video editor (Medical):   Marland Kitchen Lack of Transportation (Non-Medical):   Physical Activity:   . Days of Exercise per Week:   . Minutes of Exercise per Session:   Stress:   . Feeling of Stress :   Social Connections:   . Frequency of Communication with Friends and Family:   . Frequency of Social Gatherings with Friends and Family:   . Attends Religious Services:   . Active Member of Clubs or Organizations:   . Attends Archivist Meetings:   Marland Kitchen Marital Status:   Intimate Partner Violence:   . Fear of Current or Ex-Partner:   . Emotionally Abused:   Marland Kitchen Physically Abused:   . Sexually Abused:     Review of Systems: 12 system ROS is negative except as noted above.   Physical Exam: General:   Alert,  well-nourished, pleasant and cooperative in NAD Head:  Normocephalic and atraumatic. Eyes:  Sclera clear, no icterus.   Conjunctiva pink. Ears:  Normal auditory acuity. Nose:  No deformity, discharge,  or lesions. Mouth:  No deformity or lesions.   Neck:  Supple; no masses or thyromegaly. Lungs:  Clear throughout to auscultation.   No wheezes. Heart:  Regular rate and rhythm; no murmurs. Abdomen:  Soft, nontender, nondistended, normal bowel sounds, no rebound or guarding. No hepatosplenomegaly.   Rectal:  Deferred  Msk:  Symmetrical. No boney deformities LAD: No inguinal or umbilical LAD Extremities:  No clubbing or edema. Neurologic:  Alert and  oriented x4;  grossly nonfocal Skin:  Intact without significant lesions or rashes. Psych:  Alert and cooperative. Normal mood and affect.   Roni Scow  L. Tarri Glenn, MD, MPH 04/16/2020, 9:38 AM

## 2020-04-16 NOTE — Patient Instructions (Addendum)
I have recommended a colonoscopy for further evaluation of your low hemoglobin (anemia). If the colonoscopy is negative, the next test would be an upper endoscopy.  Tips for colonoscopy:  - Stay well hydrated for 3-4 days prior to the exam. This reduces nausea and dehydration.  - To prevent skin/hemorrhoid irritation - prior to wiping, put A&Dointment or vaseline on the toilet paper. - Keep a towel or pad on the bed.  - Drink  64oz of clear liquids in the morning of prep day (prior to starting the prep) to be sure that there is enough fluid to flush the colon and stay hydrated!!!! This is in addition to the fluids required for preparation. - Use of a flavored hard candy, such as grape Rubin Payor, can counteract some of the flavor of the prep and may prevent some nausea.   Thank you for trusting me with your gastrointestinal care!    Tressia Danas, MD, MPH

## 2020-04-21 ENCOUNTER — Ambulatory Visit (INDEPENDENT_AMBULATORY_CARE_PROVIDER_SITE_OTHER): Payer: Medicare HMO | Admitting: Physician Assistant

## 2020-04-21 ENCOUNTER — Encounter: Payer: Self-pay | Admitting: Physician Assistant

## 2020-04-21 ENCOUNTER — Other Ambulatory Visit: Payer: Self-pay

## 2020-04-21 VITALS — BP 138/80 | HR 105 | Temp 97.8°F | Resp 18 | Ht 68.0 in | Wt 147.2 lb

## 2020-04-21 DIAGNOSIS — E119 Type 2 diabetes mellitus without complications: Secondary | ICD-10-CM | POA: Diagnosis not present

## 2020-04-21 DIAGNOSIS — I10 Essential (primary) hypertension: Secondary | ICD-10-CM

## 2020-04-21 DIAGNOSIS — I152 Hypertension secondary to endocrine disorders: Secondary | ICD-10-CM

## 2020-04-21 DIAGNOSIS — E1159 Type 2 diabetes mellitus with other circulatory complications: Secondary | ICD-10-CM | POA: Diagnosis not present

## 2020-04-21 DIAGNOSIS — E519 Thiamine deficiency, unspecified: Secondary | ICD-10-CM

## 2020-04-21 DIAGNOSIS — E538 Deficiency of other specified B group vitamins: Secondary | ICD-10-CM

## 2020-04-21 LAB — HEMOGLOBIN A1C: Hgb A1c MFr Bld: 5.6 % (ref 4.6–6.5)

## 2020-04-21 LAB — VITAMIN B12: Vitamin B-12: 379 pg/mL (ref 211–911)

## 2020-04-21 NOTE — Progress Notes (Signed)
Ricardo Dawson is a 74 y.o. male here for a follow up of a pre-existing problem.  History of Present Illness:   Chief Complaint  Patient presents with  . Hypertension  . Health Maintenance    He would like to know whether he should do the cologuard or colonoscopy. He has a colonoscopy schedule for 06/09/2020.    HPI  HTN Currently taking Hyzaar 100-25 mg. At home blood pressure readings are: <140/80. Patient denies chest pain, SOB, blurred vision, dizziness, unusual headaches, lower leg swelling. Patient is compliant with medication. Denies excessive caffeine intake, stimulant usage, excessive alcohol intake, or increase in salt consumption.  BP Readings from Last 3 Encounters:  04/21/20 138/80  04/16/20 130/82  03/12/20 (!) 146/84    Vitamin B-12 and B-1 deficiency Patient was told that his vitamin levels were low by his prior PCP and was started on medications. He is wondering if he still needs to take these medications.  Diabetes 4 month follow-up. Current DM meds: Metformin 500 mg. Blood sugars at home are: not checked. Patient is compliant with medications. Denies: hypoglycemic or hyperglycemic episodes or symptoms.   Lab Results  Component Value Date   HGBA1C 5.3 12/15/2019     Past Medical History:  Diagnosis Date  . Diabetes mellitus    Type II  . Hyperlipidemia   . Hypertension      Social History   Tobacco Use  . Smoking status: Current Some Day Smoker    Types: Cigars  . Smokeless tobacco: Never Used  Substance Use Topics  . Alcohol use: Yes    Comment: Rare  . Drug use: No    History reviewed. No pertinent surgical history.  Family History  Problem Relation Age of Onset  . Cancer Neg Hx   . Colon cancer Neg Hx   . Stomach cancer Neg Hx   . Pancreatic cancer Neg Hx     Allergies  Allergen Reactions  . Ace Inhibitors     REACTION: cough 2002--altace    Current Medications:   Current Outpatient Medications:  .  aspirin 81 MG tablet,  Take 81 mg by mouth daily.  , Disp: , Rfl:  .  cyanocobalamin (,VITAMIN B-12,) 1000 MCG/ML injection, Inject 1,000 mcg into the muscle every 30 (thirty) days., Disp: , Rfl:  .  folic acid (FOLVITE) 1 MG tablet, , Disp: , Rfl:  .  losartan-hydrochlorothiazide (HYZAAR) 100-25 MG tablet, Take 1 tablet by mouth daily, Disp: 90 tablet, Rfl: 0 .  simvastatin (ZOCOR) 10 MG tablet, Take 1 tablet by mouth daily, Disp: 90 tablet, Rfl: 0 .  Sodium Sulfate-Mag Sulfate-KCl (SUTAB) [phone] MG TABS, Take 1 kit by mouth as directed., Disp: 24 tablet, Rfl: 0 .  thiamine 250 MG tablet, Take 1 tablet (250 mg total) by mouth daily. NO LONGER MANAGED BY DR. Loanne Drilling, Disp:  , Rfl:    Review of Systems:   ROS Negative unless otherwise specified per HPI.  Vitals:   Vitals:   04/21/20 1317  BP: 138/80  Pulse: (!) 105  Resp: 18  Temp: 97.8 F (36.6 C)  TempSrc: Temporal  SpO2: 96%  Weight: 147 lb 3.2 oz (66.8 kg)  Height: '5\' 8"'  (1.727 m)     Body mass index is 22.38 kg/m.  Physical Exam:   Physical Exam Vitals and nursing note reviewed.  Constitutional:      General: He is not in acute distress.    Appearance: He is well-developed. He is not ill-appearing or  toxic-appearing.  Cardiovascular:     Rate and Rhythm: Normal rate and regular rhythm.     Pulses: Normal pulses.     Heart sounds: Normal heart sounds, S1 normal and S2 normal.     Comments: No LE edema Pulmonary:     Effort: Pulmonary effort is normal.     Breath sounds: Normal breath sounds.  Skin:    General: Skin is warm and dry.  Neurological:     Mental Status: He is alert.     GCS: GCS eye subscore is 4. GCS verbal subscore is 5. GCS motor subscore is 6.  Psychiatric:        Speech: Speech normal.        Behavior: Behavior normal. Behavior is cooperative.     Assessment and Plan:   Ricardo Dawson was seen today for hypertension and health maintenance.  Diagnoses and all orders for this visit:  Type 2 diabetes mellitus  without complication, without long-term current use of insulin (HCC) Update HgbA1c. I discussed with him that I am happy to take over his DM care and will let him decide if he would like to continue to see Dr. Loanne Drilling. Will update HgbA1c and provide further recommendations. -     Hemoglobin A1c  Vitamin B12 deficiency; Vitamin B1 deficiency Update labs and will provide further recommendations as appropriate. -     Vitamin B12 -     Vitamin B1  Hypertension associated with diabetes ([place]) Well controlled with Hyzaar 100-25 mg daily.   . Reviewed expectations re: course of current medical issues. . Discussed self-management of symptoms. . Outlined signs and symptoms indicating need for more acute intervention. . Patient verbalized understanding and all questions were answered. . See orders for this visit as documented in the electronic medical record. . Patient received an After-Visit Summary.  Inda Coke, PA-C

## 2020-04-21 NOTE — Patient Instructions (Addendum)
It was great to see you!  You do not need to do the Cologuard test. Please proceed with getting your colonoscopy.  We are checking your vitamin levels and blood sugar today. I will call you with these results and the plan for your vitamins and metformin.  Your blood pressure looks great. Continue your medications as prescribed.  Follow-up with me in 2 months if you still have cough.  Continue   Take care,  Jarold Motto PA-C

## 2020-04-27 ENCOUNTER — Other Ambulatory Visit: Payer: Self-pay | Admitting: Physician Assistant

## 2020-04-27 LAB — VITAMIN B1: Vitamin B1 (Thiamine): 116 nmol/L — ABNORMAL HIGH (ref 8–30)

## 2020-05-23 ENCOUNTER — Other Ambulatory Visit: Payer: Self-pay | Admitting: Endocrinology

## 2020-05-23 DIAGNOSIS — E119 Type 2 diabetes mellitus without complications: Secondary | ICD-10-CM

## 2020-05-24 ENCOUNTER — Ambulatory Visit: Payer: Medicare HMO

## 2020-05-24 DIAGNOSIS — Z0000 Encounter for general adult medical examination without abnormal findings: Secondary | ICD-10-CM

## 2020-05-26 ENCOUNTER — Telehealth: Payer: Self-pay

## 2020-05-26 NOTE — Telephone Encounter (Signed)
Spoke with the patient to get him rescheduled from 06/14/20 due to MD being out of the office-patient stated he does not think he needs to see Dr. Everardo All anymore he goes to [facility] and was taken off of all DM medications-I requested patient confirm this with [facility] and call us back if he needs to be put back on the schedule and he agreed to this

## 2020-05-31 ENCOUNTER — Encounter: Payer: Self-pay | Admitting: Gastroenterology

## 2020-06-07 ENCOUNTER — Telehealth: Payer: Self-pay | Admitting: Gastroenterology

## 2020-06-07 NOTE — Telephone Encounter (Signed)
Sending as an FYI as Mr. Finigan cancelled his endoscopy to evaluate his anemia. Thank you.

## 2020-06-07 NOTE — Telephone Encounter (Signed)
Hey Dr Orvan Falconer this pt cancelled his colonoscopy with you for 7/28 due to financial problems. Did not wish to reschedule at this time.

## 2020-06-09 ENCOUNTER — Encounter: Payer: Medicare HMO | Admitting: Gastroenterology

## 2020-06-14 ENCOUNTER — Ambulatory Visit: Payer: Medicare HMO | Admitting: Endocrinology

## 2020-06-20 ENCOUNTER — Other Ambulatory Visit: Payer: Self-pay | Admitting: Physician Assistant

## 2020-07-21 ENCOUNTER — Other Ambulatory Visit: Payer: Self-pay | Admitting: Endocrinology

## 2020-09-02 ENCOUNTER — Other Ambulatory Visit: Payer: Self-pay

## 2020-09-02 ENCOUNTER — Telehealth: Payer: Self-pay

## 2020-09-02 DIAGNOSIS — D649 Anemia, unspecified: Secondary | ICD-10-CM

## 2020-09-02 NOTE — Telephone Encounter (Signed)
Referral was sent to [facility] - Dr. Loreta Ave

## 2020-09-02 NOTE — Telephone Encounter (Signed)
Called and made pt wife aware. 

## 2020-09-02 NOTE — Telephone Encounter (Signed)
Patient is requesting referral for colonoscopy to be changed from [facility] to [facility]   Please advise and give pt a call when we send it so they can call and schedule

## 2020-09-02 NOTE — Telephone Encounter (Signed)
I placed the referral, please let pt know when the referral process is done so they can call and schedule.

## 2020-09-23 DIAGNOSIS — E119 Type 2 diabetes mellitus without complications: Secondary | ICD-10-CM | POA: Diagnosis not present

## 2020-09-23 DIAGNOSIS — H462 Nutritional optic neuropathy: Secondary | ICD-10-CM | POA: Diagnosis not present

## 2020-09-23 DIAGNOSIS — H4720 Unspecified optic atrophy: Secondary | ICD-10-CM | POA: Diagnosis not present

## 2020-09-23 DIAGNOSIS — Z961 Presence of intraocular lens: Secondary | ICD-10-CM | POA: Diagnosis not present

## 2020-09-23 DIAGNOSIS — H2511 Age-related nuclear cataract, right eye: Secondary | ICD-10-CM | POA: Diagnosis not present

## 2020-09-23 LAB — HM DIABETES EYE EXAM

## 2020-09-24 ENCOUNTER — Other Ambulatory Visit: Payer: Self-pay | Admitting: Physician Assistant

## 2020-09-24 ENCOUNTER — Encounter: Payer: Self-pay | Admitting: Physician Assistant

## 2020-09-24 ENCOUNTER — Telehealth: Payer: Self-pay | Admitting: Physician Assistant

## 2020-09-24 MED ORDER — B-12 1000 MCG PO CAPS: 1.0000 | ORAL_CAPSULE | Freq: Every day | ORAL | 3 refills | Status: AC

## 2020-09-24 MED ORDER — FOLIC ACID 1 MG PO TABS: 1.0000 mg | ORAL_TABLET | Freq: Every day | ORAL | 3 refills | Status: DC

## 2020-09-24 MED ORDER — THIAMINE HCL 100 MG PO TABS: 100.0000 mg | ORAL_TABLET | Freq: Every day | ORAL | 3 refills | Status: DC

## 2020-09-24 NOTE — Telephone Encounter (Signed)
Spoke to pt's wife Steward Drone told her to let pt know that we received a note from his eye doctor to resume his vitamins. I have sent this in for him but I am not sure insurance will pay for them. He can discuss over the counter options with the pharmacist if any of them are unaffordable.Please restart:  B12 Folic acid Thiamine Steward Drone verbalized understanding and said they have already spoke to the pharmacist and was told OTC would be more affordable. Told her okay.

## 2020-09-24 NOTE — Telephone Encounter (Signed)
Please call patient and let him know that we received a note from his eye doctor to resume his vitamins. I have sent this in for him but I am not sure insurance will pay for them. He can discuss over the counter options with the pharmacist if any of them are unaffordable.  B12 Folic acid Thiamine   Ricardo Dawson

## 2020-11-25 ENCOUNTER — Telehealth: Payer: Self-pay | Admitting: Physician Assistant

## 2020-11-25 NOTE — Telephone Encounter (Signed)
Left message with pt spouse for patient to call back and schedule Medicare Annual Wellness Visit (AWV) either virtually OR in office.   Last AWV 04/17/18; please schedule at anytime with [facility]-Nurse Health Advisor at [facility].  This should be a 45 minute visit.  05/24/20 MED WELL VISIT WITH NURSE HEALTH ADVISOR WAS A NO SHOW

## 2020-12-09 ENCOUNTER — Ambulatory Visit (INDEPENDENT_AMBULATORY_CARE_PROVIDER_SITE_OTHER): Payer: Medicare HMO

## 2020-12-09 ENCOUNTER — Other Ambulatory Visit: Payer: Self-pay

## 2020-12-09 VITALS — BP 140/80 | HR 110 | Temp 97.9°F | Resp 20 | Wt 149.6 lb

## 2020-12-09 DIAGNOSIS — Z0000 Encounter for general adult medical examination without abnormal findings: Secondary | ICD-10-CM

## 2020-12-09 DIAGNOSIS — F17200 Nicotine dependence, unspecified, uncomplicated: Secondary | ICD-10-CM | POA: Diagnosis not present

## 2020-12-09 DIAGNOSIS — Z1211 Encounter for screening for malignant neoplasm of colon: Secondary | ICD-10-CM | POA: Diagnosis not present

## 2020-12-09 NOTE — Patient Instructions (Addendum)
[name] , Thank you for taking time to come for your Medicare Wellness Visit. I appreciate your ongoing commitment to your health goals. Please review the following plan we discussed and let me know if I can assist you in the future.   Screening recommendations/referrals: Colonoscopy: Done 08/21/17 Recommended yearly ophthalmology/optometry visit for glaucoma screening and checkup Recommended yearly dental visit for hygiene and checkup  Vaccinations: Influenza vaccine: Declined and discussed Pneumococcal vaccine: Up to date Tdap vaccine: Due and discussed Shingles vaccine: Shingrix discussed. Please contact your pharmacy for coverage information.    Covid-19: Completed 3/7 & 02/17/20  Advanced directives: Advance directive discussed with you today. Even though you declined this today please call our office should you change your mind and we can give you the proper paperwork for you to fill out.  Conditions/risks identified: stay healthy  Next appointment: Follow up in one year for your annual wellness visit.   Preventive Care 40 Years and Older, Male Preventive care refers to lifestyle choices and visits with your health care provider that can promote health and wellness. What does preventive care include?  A yearly physical exam. This is also called an annual well check.  Dental exams once or twice a year.  Routine eye exams. Ask your health care provider how often you should have your eyes checked.  Personal lifestyle choices, including:  Daily care of your teeth and gums.  Regular physical activity.  Eating a healthy diet.  Avoiding tobacco and drug use.  Limiting alcohol use.  Practicing safe sex.  Taking low doses of aspirin every day.  Taking vitamin and mineral supplements as recommended by your health care provider. What happens during an annual well check? The services and screenings done by your health care provider during your annual well check will depend  on your age, overall health, lifestyle risk factors, and family history of disease. Counseling  Your health care provider may ask you questions about your:  Alcohol use.  Tobacco use.  Drug use.  Emotional well-being.  Home and relationship well-being.  Sexual activity.  Eating habits.  History of falls.  Memory and ability to understand (cognition).  Work and work Astronomer. Screening  You may have the following tests or measurements:  Height, weight, and BMI.  Blood pressure.  Lipid and cholesterol levels. These may be checked every 5 years, or more frequently if you are over 41 years old.  Skin check.  Lung cancer screening. You may have this screening every year starting at age 7 if you have a 30-pack-year history of smoking and currently smoke or have quit within the past 15 years.  Fecal occult blood test (FOBT) of the stool. You may have this test every year starting at age 62.  Flexible sigmoidoscopy or colonoscopy. You may have a sigmoidoscopy every 5 years or a colonoscopy every 10 years starting at age 52.  Prostate cancer screening. Recommendations will vary depending on your family history and other risks.  Hepatitis C blood test.  Hepatitis B blood test.  Sexually transmitted disease (STD) testing.  Diabetes screening. This is done by checking your blood sugar (glucose) after you have not eaten for a while (fasting). You may have this done every 1-3 years.  Abdominal aortic aneurysm (AAA) screening. You may need this if you are a current or former smoker.  Osteoporosis. You may be screened starting at age 24 if you are at high risk. Talk with your health care provider about your test results, treatment  options, and if necessary, the need for more tests. Vaccines  Your health care provider may recommend certain vaccines, such as:  Influenza vaccine. This is recommended every year.  Tetanus, diphtheria, and acellular pertussis (Tdap, Td)  vaccine. You may need a Td booster every 10 years.  Zoster vaccine. You may need this after age 29.  Pneumococcal 13-valent conjugate (PCV13) vaccine. One dose is recommended after age 84.  Pneumococcal polysaccharide (PPSV23) vaccine. One dose is recommended after age 23. Talk to your health care provider about which screenings and vaccines you need and how often you need them. This information is not intended to replace advice given to you by your health care provider. Make sure you discuss any questions you have with your health care provider. Document Released: 11/26/2015 Document Revised: 07/19/2016 Document Reviewed: 08/31/2015 Elsevier Interactive Patient Education  2017 [place] Prevention in the Home Falls can cause injuries. They can happen to people of all ages. There are many things you can do to make your home safe and to help prevent falls. What can I do on the outside of my home?  Regularly fix the edges of walkways and driveways and fix any cracks.  Remove anything that might make you trip as you walk through a door, such as a raised step or threshold.  Trim any bushes or trees on the path to your home.  Use bright outdoor lighting.  Clear any walking paths of anything that might make someone trip, such as rocks or tools.  Regularly check to see if handrails are loose or broken. Make sure that both sides of any steps have handrails.  Any raised decks and porches should have guardrails on the edges.  Have any leaves, snow, or ice cleared regularly.  Use sand or salt on walking paths during winter.  Clean up any spills in your garage right away. This includes oil or grease spills. What can I do in the bathroom?  Use night lights.  Install grab bars by the toilet and in the tub and shower. Do not use towel bars as grab bars.  Use non-skid mats or decals in the tub or shower.  If you need to sit down in the shower, use a plastic, non-slip  stool.  Keep the floor dry. Clean up any water that spills on the floor as soon as it happens.  Remove soap buildup in the tub or shower regularly.  Attach bath mats securely with double-sided non-slip rug tape.  Do not have throw rugs and other things on the floor that can make you trip. What can I do in the bedroom?  Use night lights.  Make sure that you have a light by your bed that is easy to reach.  Do not use any sheets or blankets that are too big for your bed. They should not hang down onto the floor.  Have a firm chair that has side arms. You can use this for support while you get dressed.  Do not have throw rugs and other things on the floor that can make you trip. What can I do in the kitchen?  Clean up any spills right away.  Avoid walking on wet floors.  Keep items that you use a lot in easy-to-reach places.  If you need to reach something above you, use a strong step stool that has a grab bar.  Keep electrical cords out of the way.  Do not use floor polish or wax that makes floors slippery.  If you must use wax, use non-skid floor wax.  Do not have throw rugs and other things on the floor that can make you trip. What can I do with my stairs?  Do not leave any items on the stairs.  Make sure that there are handrails on both sides of the stairs and use them. Fix handrails that are broken or loose. Make sure that handrails are as long as the stairways.  Check any carpeting to make sure that it is firmly attached to the stairs. Fix any carpet that is loose or worn.  Avoid having throw rugs at the top or bottom of the stairs. If you do have throw rugs, attach them to the floor with carpet tape.  Make sure that you have a light switch at the top of the stairs and the bottom of the stairs. If you do not have them, ask someone to add them for you. What else can I do to help prevent falls?  Wear shoes that:  Do not have high heels.  Have rubber bottoms.  Are  comfortable and fit you well.  Are closed at the toe. Do not wear sandals.  If you use a stepladder:  Make sure that it is fully opened. Do not climb a closed stepladder.  Make sure that both sides of the stepladder are locked into place.  Ask someone to hold it for you, if possible.  Clearly mark and make sure that you can see:  Any grab bars or handrails.  First and last steps.  Where the edge of each step is.  Use tools that help you move around (mobility aids) if they are needed. These include:  Canes.  Walkers.  Scooters.  Crutches.  Turn on the lights when you go into a dark area. Replace any light bulbs as soon as they burn out.  Set up your furniture so you have a clear path. Avoid moving your furniture around.  If any of your floors are uneven, fix them.  If there are any pets around you, be aware of where they are.  Review your medicines with your doctor. Some medicines can make you feel dizzy. This can increase your chance of falling. Ask your doctor what other things that you can do to help prevent falls. This information is not intended to replace advice given to you by your health care provider. Make sure you discuss any questions you have with your health care provider. Document Released: 08/26/2009 Document Revised: 04/06/2016 Document Reviewed: 12/04/2014 Elsevier Interactive Patient Education  2017 [employer].

## 2020-12-09 NOTE — Progress Notes (Signed)
Subjective:   ZEUS MARQUIS is a 75 y.o. male who presents for Medicare Annual/Subsequent preventive examination.  Review of Systems     Cardiac Risk Factors include: diabetes mellitus;dyslipidemia;hypertension;male gender     Objective:    Today's Vitals   12/09/20 1038 12/09/20 1056  BP: (!) 170/88 140/80  Pulse: (!) 110   Resp: 20   Temp: 97.9 F (36.6 C)   SpO2: 99%   Weight: 149 lb 9.6 oz (67.9 kg)    Body mass index is 22.75 kg/m.  Advanced Directives 12/09/2020 04/17/2018 04/17/2017 01/24/2016 01/22/2015  Does Patient Have a Medical Advance Directive? _0   Would patient like information on creating a medical advance directive? No - Patient declined - - - -    Current Medications (verified) Outpatient Encounter Medications as of 12/09/2020  Medication Sig  . aspirin 81 MG tablet Take 81 mg by mouth daily.  . Cyanocobalamin (B-12) 1000 MCG CAPS Take 1 capsule by mouth daily.  . folic acid (FOLVITE) 1 MG tablet Take 1 tablet (1 mg total) by mouth daily. (Patient not taking: Reported on 12/09/2020)  . losartan-hydrochlorothiazide (HYZAAR) 100-25 MG tablet Take 1 tablet by mouth daily (Patient not taking: Reported on 12/09/2020)  . simvastatin (ZOCOR) 10 MG tablet TAKE ONE TABLET BY MOUTH DAILY (Patient not taking: Reported on 12/09/2020)  . Sodium Sulfate-Mag Sulfate-KCl (SUTAB) [phone] MG TABS Take 1 kit by mouth as directed. (Patient not taking: Reported on 12/09/2020)  . thiamine 100 MG tablet Take 1 tablet (100 mg total) by mouth daily. (Patient not taking: Reported on 12/09/2020)  . [DISCONTINUED] metFORMIN (GLUCOPHAGE-XR) 500 MG 24 hr tablet TAKE TWO TABLETS  DAILY WITH BREAKFAST (Patient not taking: Reported on 12/09/2020)   No facility-administered encounter medications on file as of 12/09/2020.    Allergies (verified) Ace inhibitors   History: Past Medical History:  Diagnosis Date  . Diabetes mellitus    Type II  . Hyperlipidemia   .  Hypertension    History reviewed. No pertinent surgical history. Family History  Problem Relation Age of Onset  . Cancer Neg Hx   . Colon cancer Neg Hx   . Stomach cancer Neg Hx   . Pancreatic cancer Neg Hx    Social History   Socioeconomic History  . Marital status: Married    Spouse name: Not on file  . Number of children: Not on file  . Years of education: Not on file  . Highest education level: Not on file  Occupational History  . Occupation: retired  Tobacco Use  . Smoking status: Current Some Day Smoker    Types: Cigars  . Smokeless tobacco: Never Used  Vaping Use  . Vaping Use: Never used  Substance and Sexual Activity  . Alcohol use: Yes    Comment: Rare  . Drug use: No  . Sexual activity: Yes  Other Topics Concern  . Not on file  Social History Narrative   Janitorial -- retired   One son -- lives in [place] to watch the [employer]; baseball, football   Social Determinants of Health   Financial Resource Strain: Low Risk   . Difficulty of Paying Living Expenses: Not hard at all  Food Insecurity: No Food Insecurity  . Worried About Charity fundraiser in the Last Year: Never true  . Ran Out of Food in the Last Year: Never true  Transportation Needs: No Transportation Needs  . Lack of Transportation (  Medical): No  . Lack of Transportation (Non-Medical): No  Physical Activity: Sufficiently Active  . Days of Exercise per Week: 5 days  . Minutes of Exercise per Session: 30 min  Stress: No Stress Concern Present  . Feeling of Stress : Not at all  Social Connections: Moderately Isolated  . Frequency of Communication with Friends and Family: Once a week  . Frequency of Social Gatherings with Friends and Family: Never  . Attends Religious Services: More than 4 times per year  . Active Member of Clubs or Organizations: No  . Attends Archivist Meetings: Never  . Marital Status: Married    Tobacco Counseling Ready to quit: Not  Answered Counseling given: Not Answered   Clinical Intake:  Pre-visit preparation completed: Yes  Pain : No/denies pain     BMI - recorded: 22.75 Nutritional Status: BMI of 19-24  Normal Nutritional Risks: None Diabetes: Yes CBG done?: No Did pt. bring in CBG monitor from home?: No  How often do you need to have someone help you when you read instructions, pamphlets, or other written materials from your doctor or pharmacy?: 1 - Never  Diabetic?Nutrition Risk Assessment:  Has the patient had any N/V/D within the last 2 months?  No  Does the patient have any non-healing wounds?  No  Has the patient had any unintentional weight loss or weight gain?  No   Diabetes:  Is the patient diabetic?  Yes  If diabetic, was a CBG obtained today?  No  Did the patient bring in their glucometer from home?  No  How often do you monitor your CBG's? N/A.   Financial Strains and Diabetes Management:  Are you having any financial strains with the device, your supplies or your medication? No .  Does the patient want to be seen by Chronic Care Management for management of their diabetes?  No  Would the patient like to be referred to a Nutritionist or for Diabetic Management?  No   Diabetic Exams:  Diabetic Eye Exam: Completed 09/23/20 Diabetic Foot Exam: Completed 03/12/20   Interpreter Needed?: No  Information entered by :: Charlott Rakes, LPN   Activities of Daily Living In your present state of health, do you have any difficulty performing the following activities: 12/09/2020 04/21/2020  Hearing? N N  Vision? N N  Difficulty concentrating or making decisions? N N  Walking or climbing stairs? N N  Dressing or bathing? N N  Doing errands, shopping? N N  Preparing Food and eating ? N -  Using the Toilet? N -  In the past six months, have you accidently leaked urine? N -  Do you have problems with loss of bowel control? N -  Managing your Medications? N -  Managing your Finances? N  -  Housekeeping or managing your Housekeeping? N -  Some recent data might be hidden    Patient Care Team: Inda Coke, [place] as PCP - General (Physician Assistant) Clent Jacks, MD as Consulting Physician (Ophthalmology)  Indicate any recent Medical Services you may have received from other than [facility] providers in the past year (date may be approximate).     Assessment:   This is a routine wellness examination for Jayesh.  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 Patient Stated     Stay healthy      Depression Screen PHQ 2/9 Scores 12/09/2020 03/12/2020 01/22/2015  PHQ - 2 Score 0 0 0    Fall Risk Fall Risk  12/09/2020 03/12/2020 01/22/2015  Falls in the past year? 0 0 No  Number falls in past yr: 1 - -  Comment on ice shoveling snow - -  Injury with Fall? 0 - -  Risk for fall due to : Impaired vision - -  Follow up Falls prevention discussed - -    FALL RISK PREVENTION PERTAINING TO THE HOME:  Any stairs in or around the home? No  If so, are there any without handrails? No  Home free of loose throw rugs in walkways, pet beds, electrical cords, etc? Yes  Adequate lighting in your home to reduce risk of falls? Yes   ASSISTIVE DEVICES UTILIZED TO PREVENT FALLS:  Life alert? No  Use of a cane, walker or w/c? No  Grab bars in the bathroom? Yes  Shower chair or bench in shower? Yes  Elevated toilet seat or a handicapped toilet? No   TIMED UP AND GO:  Was the test performed? Yes .  Length of time to ambulate 10 feet: 10 sec.   Gait steady and fast without use  of assistive device  Cognitive Function:     6CIT Screen 12/09/2020  What Year? 0 points  What month? 0 points  Count back from 20 0 points  Months in reverse 0 points  Repeat phrase 2 points    Immunizations Immunization History  Administered Date(s) Administered  . Influenza Whole 09/04/2008, 12/09/2009  . Influenza, High Dose Seasonal PF 10/06/2015  . Influenza,inj,Quad PF,6+ Mos 01/22/2015, 07/26/2016, 10/17/2017  . PFIZER(Purple Top)SARS-COV-2 Vaccination 01/18/2020, 02/17/2020  . Pneumococcal Conjugate-13 01/22/2015  . Pneumococcal Polysaccharide-23 03/06/2011  . Td 09/04/2008    TDAP status: Due, Education has been provided regarding the importance of this vaccine. Advised may receive this vaccine at local pharmacy or Health Dept. Aware to provide a copy of the vaccination record if obtained from local pharmacy or Health Dept. Verbalized acceptance and understanding.  Flu Vaccine status: Declined, Education has been provided regarding the importance of this vaccine but patient still declined. Advised may receive this vaccine at local pharmacy or Health Dept. Aware to provide a copy of the vaccination record if obtained from local pharmacy or Health Dept. Verbalized acceptance and understanding.  Pneumococcal vaccine status: Up to date  Covid-19 vaccine status: Completed vaccines Booster recommended   Qualifies for Shingles Vaccine? Yes   Zostavax completed No   Shingrix Completed?: No.    Education has been provided regarding the importance of this vaccine. Patient has been advised to call insurance company to determine out of pocket expense if they have not yet received this vaccine. Advised may also receive vaccine at local pharmacy or Health Dept. Verbalized acceptance and understanding.  Screening Tests Health Maintenance  Topic Date Due  . Fecal DNA (Cologuard)  Never done  . COVID-19 Vaccine (3 - Booster for Pfizer series) 08/18/2020  . HEMOGLOBIN A1C  10/21/2020   . INFLUENZA VACCINE  02/10/2021 (Originally 06/13/2020)  . TETANUS/TDAP  03/12/2021 (Originally 09/04/2018)  . FOOT EXAM  03/12/2021  .  OPHTHALMOLOGY EXAM  09/23/2021  . Hepatitis C Screening  Completed  . PNA vac Low Risk Adult  Completed    Health Maintenance  Health Maintenance Due  Topic Date Due  . Fecal DNA (Cologuard)  Never done  . COVID-19 Vaccine (3 - Booster for Pfizer series) 08/18/2020  . HEMOGLOBIN A1C  10/21/2020    Colorectal cancer screening: Referral to GI placed 12/09/20. Pt aware the office will call re: appt.  Lung Cancer Screening: (Low Dose CT Chest recommended if Age 59-80 years, 30 pack-year currently smoking OR have quit w/in 15years.) does qualify.   Lung Cancer Screening Referral: Order placed   Additional Screening:  Hepatitis C Screening: Completed 10/17/17  Vision Screening: Recommended annual ophthalmology exams for early detection of glaucoma and other disorders of the eye. Is the patient up to date with their annual eye exam?  Yes  Who is the provider or what is the name of the office in which the patient attends annual eye exams? Dr Katy Fitch   Dental Screening: Recommended annual dental exams for proper oral hygiene  Community Resource Referral / Chronic Care Management: CRR required this visit?  No   CCM required this visit?  No      Plan:     I have personally reviewed and noted the following in the patient's chart:   . Medical and social history . Use of alcohol, tobacco or illicit drugs  . Current medications and supplements . Functional ability and status . Nutritional status . Physical activity . Advanced directives . List of other physicians . Hospitalizations, surgeries, and ER visits in previous 12 months . Vitals . Screenings to include cognitive, depression, and falls . Referrals and appointments  In addition, I have reviewed and discussed with patient certain preventive protocols, quality metrics, and best practice  recommendations. A written personalized care plan for preventive services as well as general preventive health recommendations were provided to patient.     Willette Brace, LPN   [id number]   Nurse Notes: None

## 2020-12-16 ENCOUNTER — Telehealth: Payer: Self-pay | Admitting: *Deleted

## 2020-12-16 NOTE — Telephone Encounter (Signed)
Please see message. °

## 2020-12-16 NOTE — Telephone Encounter (Signed)
Ricardo Dawson  [address] [place], [place] Messaged [name] to schedule pt for CT. She needs the pack year hx and the date that [name] did smoking cessation counseling with him.   [name] also asked if she wanted to refer him to Kandice Robinsons who is the Lung Cancer screening navigator.   Please advise.

## 2020-12-16 NOTE — Telephone Encounter (Signed)
Looks like this order for CT scan lung cancer screening was put in by [place] during his recent [employer]. I would like for patient to be referred to Kandice Robinsons for Lung Cancer Screening Navigator. Please let us know the correct referral for this. I'm fine with cancelling CT and letting Maralyn Sago take over this role.  She did smoking cessation counseling with him, I did not. If that matters, please have patient schedule appointment with me to discuss.  I do not know the pack year and date for his smoking history. Please call patient and has how long he has been smoking and how much.

## 2020-12-20 ENCOUNTER — Telehealth: Payer: Self-pay | Admitting: *Deleted

## 2020-12-20 NOTE — Telephone Encounter (Signed)
Please call patient and see if he has questions.   Does he have something going on that is causing him to want a CT of his chest? If so, please schedule an appointment.   Does he just want preventative screening? We can order the test but he will need to pay out of pocket.   Ricardo Dawson

## 2020-12-20 NOTE — Telephone Encounter (Signed)
See other message regarding CT scan referral.

## 2020-12-20 NOTE — Telephone Encounter (Signed)
Spoke to pt's wife Steward Drone, told her there has been some confusion about the CT scan. Asked her if pt has anything going on that he is wanting a CT scan. Steward Drone said there is nothing going on that she is aware of and he was for wellness visit and she ordered it for him. She said pt has a cough for over a year and smokes cigars. Told her to tell pt to schedule an appt to discuss with [facility]. Steward Drone verbalized understanding.

## 2021-11-28 ENCOUNTER — Telehealth: Payer: Self-pay | Admitting: Physician Assistant

## 2021-11-28 NOTE — Telephone Encounter (Signed)
Copied from CRM [id number]. Topic: Medicare AWV >> Nov 28, 2021  1:51 PM Harris-Coley, [place]on Gully wrote: Reason for CRM: Called 11/28/21 to r/s AWV 12/15/21 appt, no voicemail khc. Please reschedule Annual Wellness Visit.  Please schedule with Nurse Health Advisor Lanier Ensign, RN at [facility].  Please call [phone] ask for [facility]

## 2021-12-15 ENCOUNTER — Ambulatory Visit: Payer: Medicare HMO

## 2022-01-05 DIAGNOSIS — H462 Nutritional optic neuropathy: Secondary | ICD-10-CM | POA: Diagnosis not present

## 2022-01-05 DIAGNOSIS — H4720 Unspecified optic atrophy: Secondary | ICD-10-CM | POA: Diagnosis not present

## 2022-01-05 DIAGNOSIS — E119 Type 2 diabetes mellitus without complications: Secondary | ICD-10-CM | POA: Diagnosis not present

## 2022-01-05 DIAGNOSIS — H2511 Age-related nuclear cataract, right eye: Secondary | ICD-10-CM | POA: Diagnosis not present

## 2022-01-05 DIAGNOSIS — Z961 Presence of intraocular lens: Secondary | ICD-10-CM | POA: Diagnosis not present

## 2022-01-05 LAB — HM DIABETES EYE EXAM

## 2022-02-06 ENCOUNTER — Ambulatory Visit: Payer: Medicare HMO | Admitting: Physician Assistant

## 2022-02-13 ENCOUNTER — Ambulatory Visit (INDEPENDENT_AMBULATORY_CARE_PROVIDER_SITE_OTHER): Payer: Medicare HMO | Admitting: Physician Assistant

## 2022-02-13 ENCOUNTER — Telehealth: Payer: Self-pay

## 2022-02-13 ENCOUNTER — Telehealth: Payer: Self-pay | Admitting: Physician Assistant

## 2022-02-13 VITALS — BP 154/64 | HR 85 | Temp 98.2°F | Ht 68.0 in | Wt 146.2 lb

## 2022-02-13 DIAGNOSIS — E785 Hyperlipidemia, unspecified: Secondary | ICD-10-CM

## 2022-02-13 DIAGNOSIS — R5383 Other fatigue: Secondary | ICD-10-CM | POA: Diagnosis not present

## 2022-02-13 DIAGNOSIS — E519 Thiamine deficiency, unspecified: Secondary | ICD-10-CM

## 2022-02-13 DIAGNOSIS — E1159 Type 2 diabetes mellitus with other circulatory complications: Secondary | ICD-10-CM | POA: Diagnosis not present

## 2022-02-13 DIAGNOSIS — E1169 Type 2 diabetes mellitus with other specified complication: Secondary | ICD-10-CM | POA: Diagnosis not present

## 2022-02-13 DIAGNOSIS — E119 Type 2 diabetes mellitus without complications: Secondary | ICD-10-CM

## 2022-02-13 DIAGNOSIS — E538 Deficiency of other specified B group vitamins: Secondary | ICD-10-CM

## 2022-02-13 DIAGNOSIS — I10 Essential (primary) hypertension: Secondary | ICD-10-CM | POA: Diagnosis not present

## 2022-02-13 DIAGNOSIS — I152 Hypertension secondary to endocrine disorders: Secondary | ICD-10-CM

## 2022-02-13 DIAGNOSIS — D649 Anemia, unspecified: Secondary | ICD-10-CM

## 2022-02-13 LAB — COMPREHENSIVE METABOLIC PANEL
ALT: 11 U/L (ref 0–53)
AST: 19 U/L (ref 0–37)
Albumin: 4 g/dL (ref 3.5–5.2)
Alkaline Phosphatase: 56 U/L (ref 39–117)
BUN: 13 mg/dL (ref 6–23)
CO2: 24 mEq/L (ref 19–32)
Calcium: 9.5 mg/dL (ref 8.4–10.5)
Chloride: 107 mEq/L (ref 96–112)
Creatinine, Ser: 1.13 mg/dL (ref 0.40–1.50)
GFR: 62.87 mL/min (ref >60.00)
Glucose, Bld: 142 mg/dL — ABNORMAL HIGH (ref 70–99)
Potassium: 4.8 mEq/L (ref 3.5–5.1)
Sodium: 141 mEq/L (ref 135–145)
Total Bilirubin: 0.3 mg/dL (ref 0.2–1.2)
Total Protein: 6.2 g/dL (ref 6.0–8.3)

## 2022-02-13 LAB — CBC WITH DIFFERENTIAL/PLATELET
Basophils Absolute: 0 10*3/uL (ref 0.0–0.1)
Basophils Relative: 0.6 % (ref 0.0–3.0)
Eosinophils Absolute: 0 10*3/uL (ref 0.0–0.7)
Eosinophils Relative: 0.2 % (ref 0.0–5.0)
HCT: 19.7 % — CL (ref 39.0–52.0)
Hemoglobin: 6.4 g/dL — CL (ref 13.0–17.0)
Lymphocytes Relative: 13.1 % (ref 12.0–46.0)
Lymphs Abs: 0.7 10*3/uL (ref 0.7–4.0)
MCHC: 32.7 g/dL (ref 30.0–36.0)
MCV: 81.8 fl (ref 78.0–100.0)
Monocytes Absolute: 0.4 10*3/uL (ref 0.1–1.0)
Monocytes Relative: 7.3 % (ref 3.0–12.0)
Neutro Abs: 4.2 10*3/uL (ref 1.4–7.7)
Neutrophils Relative %: 78.8 % — ABNORMAL HIGH (ref 43.0–77.0)
Platelets: 358 10*3/uL (ref 150.0–400.0)
RBC: 2.4 Mil/uL — ABNORMAL LOW (ref 4.22–5.81)
RDW: 19 % — ABNORMAL HIGH (ref 11.5–15.5)
WBC: 5.4 10*3/uL (ref 4.0–10.5)

## 2022-02-13 LAB — VITAMIN B12: Vitamin B-12: 511 pg/mL (ref 211–911)

## 2022-02-13 LAB — LIPID PANEL
Cholesterol: 137 mg/dL (ref 0–200)
HDL: 61.9 mg/dL (ref >39.00)
LDL Cholesterol: 64 mg/dL (ref 0–99)
NonHDL: 74.88
Total CHOL/HDL Ratio: 2
Triglycerides: 56 mg/dL (ref 0.0–149.0)
VLDL: 11.2 mg/dL (ref 0.0–40.0)

## 2022-02-13 LAB — IBC + FERRITIN
Ferritin: 6.6 ng/mL — ABNORMAL LOW (ref 22.0–322.0)
Iron: 16 ug/dL — ABNORMAL LOW (ref 42–165)
Saturation Ratios: 3.8 % — ABNORMAL LOW (ref 20.0–50.0)
TIBC: 421.4 ug/dL (ref 250.0–450.0)
Transferrin: 301 mg/dL (ref 212.0–360.0)

## 2022-02-13 LAB — TSH: TSH: 0.96 u[IU]/mL (ref 0.35–5.50)

## 2022-02-13 LAB — HEMOGLOBIN A1C: Hgb A1c MFr Bld: 5.9 % (ref 4.6–6.5)

## 2022-02-13 MED ORDER — LOSARTAN POTASSIUM-HCTZ 100-25 MG PO TABS: ORAL_TABLET | ORAL | 1 refills | Status: DC

## 2022-02-13 MED ORDER — SIMVASTATIN 10 MG PO TABS: ORAL_TABLET | ORAL | 1 refills | Status: DC

## 2022-02-13 NOTE — Progress Notes (Signed)
Ricardo Dawson is a 76 y.o. male here for fatigue. ? ?History of Present Illness:  ? ?Chief Complaint  ?Patient presents with  ? Fatigue  ?  Pt stated that over the last month he has had a decreased in energy. He does work out in his yard but energy is not where it should be.  ? ? ?HPI ? ?Fatigue/Generalized Weakness  ?[name] presents with c/o increased feelings of fatigue for the past month. States that he normally does work out in his yard, but noticed that his energy levels weren't where they usually are. Reports that prior to increased fatigue he was able to mow his lawn without stopping but now he needs to rest at least 20 minutes halfway through. Due to being known for being a doer and having daily tasks he needs to tend to, he decided to have this further evaluated.  ? ?Denies chest pain, shortness of breath, LE swelling, recent illness, changes to appetite, or unintentional weight loss.  ? ? ?HLD  ?Demarcus was previously compliant with taking simvastatin 10 mg daily with no adverse effects, but had stopped use in April of 2021. Pt states he stopped use due to believing that was one of the medications he was meant to stop. At this time he is interested in restarting this medication if needed. Denies CP or SOB. He is taking 81 mg ASA. ? ? ?Hx of Diabetes ?Current DM meds: none. Blood sugars at home are not checked. Pt was following up with Dr. Loanne Drilling, endocrinology about this issue but was later dismissed due to controlled A1c. At this time he is managing well. Denies: hypoglycemic or hyperglycemic episodes or symptoms.  ? ?Lab Results  ?Component Value Date  ? HGBA1C 5.6 04/21/2020  ? ? ?HTN ?Pt reports he has not been taking losartan-HCTZ 100-25 mg daily due to believing this was a medication he had to stop. At home blood pressure readings are: not checked. Patient denies chest pain, SOB, blurred vision, dizziness, unusual headaches, lower leg swelling. Denies excessive caffeine intake, stimulant usage,  excessive alcohol intake, or increase in salt consumption. Pt is interested in restarting previous medication.  ? ?BP Readings from Last 3 Encounters:  ?02/13/22 (!) 154/64  ?12/09/20 140/80  ?04/21/20 138/80  ? ?B12 Deficiency  ?Pt reports he is currently compliant with taking B12 vitamin 1000 mcg daily with no complications. Despite mild fatigue, he is managing well.  ? ?B1 Deficiency  ?[name] is compliant with taking thiamine [id number] mg daily and folic acid 1 mg daily with no adverse effects. He is tolerating well. Denies concerning sx.  ? ? ?Anemia  ?Following our visit on 03/12/20, I referred patient to gastroenterology for further evaluation of anemia. Although he followed up with Dr. Tarri Glenn, gastroenterology on 04/16/20 and decided to go through with a colonoscopy, he was unable to due to scheduling conflicts. Pt admits he never followed up about this but is in agreement to reconnect with gastroenterology for further evaluation of this.  ? ? ?Past Medical History:  ?Diagnosis Date  ? Diabetes mellitus   ? Type II  ? Hyperlipidemia   ? Hypertension   ? ?  ?Social History  ? ?Tobacco Use  ? Smoking status: Some Days  ?  Types: Cigars  ? Smokeless tobacco: Never  ?Vaping Use  ? Vaping Use: Never used  ?Substance Use Topics  ? Alcohol use: Yes  ?  Comment: Rare  ? Drug use: No  ? ? ?No past surgical history  on file. ? ?Family History  ?Problem Relation Age of Onset  ? Cancer Neg Hx   ? Colon cancer Neg Hx   ? Stomach cancer Neg Hx   ? Pancreatic cancer Neg Hx   ? ? ?Allergies  ?Allergen Reactions  ? Ace Inhibitors   ?  REACTION: cough 2002--altace  ? ? ?Current Medications:  ? ?Current Outpatient Medications:  ?  aspirin 81 MG tablet, Take 81 mg by mouth daily., Disp: , Rfl:  ?  Cyanocobalamin (B-12) 1000 MCG CAPS, Take 1 capsule by mouth daily., Disp: 90 capsule, Rfl: 3 ?  thiamine 100 MG tablet, Take 1 tablet (100 mg total) by mouth daily., Disp: 90 tablet, Rfl: 3 ?  folic acid (FOLVITE) 1 MG tablet, Take 1  tablet (1 mg total) by mouth daily. (Patient not taking: Reported on 02/13/2022), Disp: 90 tablet, Rfl: 3 ?  losartan-hydrochlorothiazide (HYZAAR) 100-25 MG tablet, Take 1 tablet by mouth daily, Disp: 90 tablet, Rfl: 1 ?  simvastatin (ZOCOR) 10 MG tablet, TAKE ONE TABLET BY MOUTH DAILY, Disp: 90 tablet, Rfl: 1  ? ?Review of Systems:  ? ?ROS ?Negative unless otherwise specified per HPI. ?Vitals:  ? ?Vitals:  ? 02/13/22 1320  ?BP: (!) 154/64  ?Pulse: 85  ?Temp: 98.2 ?F (36.8 ?C)  ?SpO2: 98%  ?Weight: 146 lb 3.2 oz (66.3 kg)  ?Height: 5\' 8"  (1.727 m)  ?   ?Body mass index is 22.23 kg/m?. ? ?Physical Exam:  ? ?Physical Exam ?Vitals and nursing note reviewed.  ?Constitutional:   ?   Appearance: Normal appearance. He is well-developed.  ?HENT:  ?   Head: Normocephalic and atraumatic.  ?   Right Ear: Tympanic membrane, ear canal and external ear normal. Tympanic membrane is not erythematous, retracted or bulging.  ?   Left Ear: Tympanic membrane, ear canal and external ear normal. Tympanic membrane is not erythematous, retracted or bulging.  ?   Nose: Nose normal.  ?   Mouth/Throat:  ?   Pharynx: Uvula midline. No posterior oropharyngeal erythema.  ?Eyes:  ?   Pupils: Pupils are equal, round, and reactive to light.  ?Cardiovascular:  ?   Rate and Rhythm: Normal rate and regular rhythm.  ?   Heart sounds: Normal heart sounds.  ?Pulmonary:  ?   Effort: Pulmonary effort is normal.  ?   Breath sounds: Normal breath sounds.  ?Abdominal:  ?   General: Bowel sounds are normal. There is no distension.  ?   Palpations: Abdomen is soft. There is no mass.  ?   Tenderness: There is no abdominal tenderness. There is no guarding or rebound.  ?Musculoskeletal:     ?   General: Normal range of motion.  ?   Cervical back: Normal range of motion and neck supple.  ?Lymphadenopathy:  ?   Cervical: No cervical adenopathy.  ?Skin: ?   General: Skin is warm and dry.  ?Neurological:  ?   Mental Status: He is alert and oriented to person, place, and  time.  ?   Cranial Nerves: No cranial nerve deficit.  ?   Coordination: Coordination normal.  ?   Deep Tendon Reflexes: Reflexes are normal and symmetric. Reflexes normal.  ?Psychiatric:     ?   Behavior: Behavior normal.     ?   Thought Content: Thought content normal.     ?   Judgment: Judgment normal.  ? ? ?Assessment and Plan:  ? ?Anemia, unspecified type ?Encouraged patient to reach out to  Dr. Tarri Glenn, gastroenterology to schedule colonoscopy  ?Will update CBC, iron and ferritin panel today ? ?Type 2 diabetes mellitus without complication, without long-term current use of insulin ([place]) ?Update HgbA1c today, will restart medication as indicated  ?Encouraged patient to continue participating in healthy eating and daily exercise ? ?Vitamin B12 deficiency; Vitamin B1 deficiency ?Update labs, will make recommendations accordingly  ?Continue B12 vitamin 1000 mcg daily  ?Continue thiamine [id number] mg daily and folic acid 1 mg daily  ? ?Hypertension associated with diabetes ([place]) ?Uncontrolled ?Restart Losartan-HCTZ 100-25 mg daily  ?Monitor BP regularly 1-2 times a week ?I advised patient that if BP readings are consistently >130/80, to reach out to office and medication will be adjusted ?Follow up in 3 months, sooner if concerns ? ?Hyperlipidemia associated with type 2 diabetes mellitus ([place]) ?Update lipid profile, will adjust medication as indicated by results  ?Restart simvastatin 10 mg daily  ? ?Fatigue, unspecified type ?Unclear etiology ?Recommend updating blood work to assess for underlying causes and updating A1c ?Recommend calling GI to f/u on colonoscopy ?If all labs WNL and sx persist, recommend follow-up in 1 month for further evaluation ? ?I,Havlyn C Ratchford,acting as a scribe for [employer], PA.,have documented all relevant documentation on the behalf of Inda Coke, PA,as directed by  Inda Coke, PA while in the presence of Inda Coke, [place]. ? ?IInda Coke, PA, have reviewed all  documentation for this visit. The documentation on 02/13/22 for the exam, diagnosis, procedures, and orders are all accurate and complete. ? ? ?Inda Coke, PA-C ? ?

## 2022-02-13 NOTE — Telephone Encounter (Signed)
Phone just kept ringing

## 2022-02-13 NOTE — Telephone Encounter (Signed)
Unable to LVM. Phone just kept ringing. ?

## 2022-02-13 NOTE — Telephone Encounter (Signed)
Tonya called from [facility] with a critical @ 4:42 PM ?Hemoglobin 6.4 ?Hematocrit 19.7 ?

## 2022-02-13 NOTE — Patient Instructions (Signed)
It was great to see you! ? ?Restart your blood pressure and cholesterol medications --> THESE WERE SENT TO THE [facility] ? ?We are updating blood work today and will be in touch with results ? ?Please call the gastroenterologist back to have your colonoscopy ? ?Let's follow-up in 3 months to recheck your blood pressure, sooner if you have concerns. ? ?Take care, ? ?Jarold Motto PA-C  ?

## 2022-02-14 ENCOUNTER — Emergency Department (HOSPITAL_COMMUNITY)
Admission: EM | Admit: 2022-02-14 | Discharge: 2022-02-14 | Disposition: A | Discharge status: Home / Self Care | Payer: Medicare HMO | Attending: Emergency Medicine | Admitting: Emergency Medicine

## 2022-02-14 ENCOUNTER — Encounter (HOSPITAL_COMMUNITY): Payer: Self-pay | Admitting: Emergency Medicine

## 2022-02-14 DIAGNOSIS — D649 Anemia, unspecified: Secondary | ICD-10-CM | POA: Diagnosis not present

## 2022-02-14 DIAGNOSIS — Z79899 Other long term (current) drug therapy: Secondary | ICD-10-CM | POA: Diagnosis not present

## 2022-02-14 DIAGNOSIS — D509 Iron deficiency anemia, unspecified: Secondary | ICD-10-CM | POA: Diagnosis not present

## 2022-02-14 DIAGNOSIS — R799 Abnormal finding of blood chemistry, unspecified: Secondary | ICD-10-CM | POA: Insufficient documentation

## 2022-02-14 DIAGNOSIS — R42 Dizziness and giddiness: Secondary | ICD-10-CM | POA: Insufficient documentation

## 2022-02-14 DIAGNOSIS — Z7982 Long term (current) use of aspirin: Secondary | ICD-10-CM | POA: Diagnosis not present

## 2022-02-14 DIAGNOSIS — R531 Weakness: Secondary | ICD-10-CM | POA: Insufficient documentation

## 2022-02-14 LAB — COMPREHENSIVE METABOLIC PANEL
ALT: 13 U/L (ref 0–44)
AST: 20 U/L (ref 15–41)
Albumin: 3.7 g/dL (ref 3.5–5.0)
Alkaline Phosphatase: 54 U/L (ref 38–126)
Anion gap: 5 (ref 5–15)
BUN: 14 mg/dL (ref 8–23)
CO2: 21 mmol/L — ABNORMAL LOW (ref 22–32)
Calcium: 9.1 mg/dL (ref 8.9–10.3)
Chloride: 114 mmol/L — ABNORMAL HIGH (ref 98–111)
Creatinine, Ser: 1.11 mg/dL (ref 0.61–1.24)
GFR, Estimated: >60 mL/min (ref >60)
Glucose, Bld: 161 mg/dL — ABNORMAL HIGH (ref 70–99)
Potassium: 4.4 mmol/L (ref 3.5–5.1)
Sodium: 140 mmol/L (ref 135–145)
Total Bilirubin: 0.4 mg/dL (ref 0.3–1.2)
Total Protein: 6.5 g/dL (ref 6.5–8.1)

## 2022-02-14 LAB — CBC WITH DIFFERENTIAL/PLATELET
Abs Immature Granulocytes: 0.01 10*3/uL (ref 0.00–0.07)
Basophils Absolute: 0 10*3/uL (ref 0.0–0.1)
Basophils Relative: 1 %
Eosinophils Absolute: 0 10*3/uL (ref 0.0–0.5)
Eosinophils Relative: 1 %
HCT: 20.1 % — ABNORMAL LOW (ref 39.0–52.0)
Hemoglobin: 6.5 g/dL — CL (ref 13.0–17.0)
Immature Granulocytes: 0 %
Lymphocytes Relative: 16 %
Lymphs Abs: 0.7 10*3/uL (ref 0.7–4.0)
MCH: 26.9 pg (ref 26.0–34.0)
MCHC: 32.3 g/dL (ref 30.0–36.0)
MCV: 83.1 fL (ref 80.0–100.0)
Monocytes Absolute: 0.3 10*3/uL (ref 0.1–1.0)
Monocytes Relative: 7 %
Neutro Abs: 3.1 10*3/uL (ref 1.7–7.7)
Neutrophils Relative %: 75 %
Platelets: 373 10*3/uL (ref 150–400)
RBC: 2.42 MIL/uL — ABNORMAL LOW (ref 4.22–5.81)
RDW: 16.4 % — ABNORMAL HIGH (ref 11.5–15.5)
WBC: 4.1 10*3/uL (ref 4.0–10.5)
nRBC: 0 % (ref 0.0–0.2)

## 2022-02-14 LAB — ABO/RH: ABO/RH(D): O NEG

## 2022-02-14 LAB — PROTIME-INR
INR: 1.1 (ref 0.8–1.2)
Prothrombin Time: 13.6 seconds (ref 11.4–15.2)

## 2022-02-14 LAB — APTT: aPTT: 29 seconds (ref 24–36)

## 2022-02-14 LAB — POC OCCULT BLOOD, ED: Fecal Occult Bld: NEGATIVE

## 2022-02-14 LAB — PREPARE RBC (CROSSMATCH)

## 2022-02-14 MED ORDER — SODIUM CHLORIDE 0.9 % IV SOLN: 10.0000 mL/h | Freq: Once | INTRAVENOUS | Status: DC

## 2022-02-14 NOTE — ED Provider Notes (Signed)
Patient presents for symptomatic anemia.  This is likely from slow, ongoing chronic blood loss.  He has no interest in staying in the hospital.  He is currently undergoing 2 units of PRBC transfusion.  Following transfusion, he can be discharged. ?Physical Exam  ?BP (!) 145/78   Pulse 85   Temp 98.3 ?F (36.8 ?C) (Oral)   Resp 18   Ht 5\' 8"  (1.727 m)   Wt 66.7 kg   SpO2 97%   BMI 22.35 kg/m?  ? ?Physical Exam ?Vitals and nursing note reviewed.  ?Constitutional:   ?   General: He is not in acute distress. ?   Appearance: Normal appearance. He is well-developed. He is not ill-appearing, toxic-appearing or diaphoretic.  ?HENT:  ?   Head: Normocephalic and atraumatic.  ?   Right Ear: External ear normal.  ?   Left Ear: External ear normal.  ?   Nose: Nose normal.  ?   Mouth/Throat:  ?   Mouth: Mucous membranes are moist.  ?   Pharynx: Oropharynx is clear.  ?Eyes:  ?   Extraocular Movements: Extraocular movements intact.  ?   Conjunctiva/sclera: Conjunctivae normal.  ?Cardiovascular:  ?   Rate and Rhythm: Normal rate and regular rhythm.  ?   Heart sounds: No murmur heard. ?Pulmonary:  ?   Effort: Pulmonary effort is normal. No respiratory distress.  ?Abdominal:  ?   Palpations: Abdomen is soft.  ?   Tenderness: There is no abdominal tenderness.  ?Musculoskeletal:     ?   General: No swelling. Normal range of motion.  ?   Cervical back: Normal range of motion and neck supple.  ?Skin: ?   General: Skin is warm and dry.  ?   Capillary Refill: Capillary refill takes less than 2 seconds.  ?   Coloration: Skin is not jaundiced or pale.  ?Neurological:  ?   General: No focal deficit present.  ?   Mental Status: He is alert and oriented to person, place, and time.  ?   Cranial Nerves: No cranial nerve deficit.  ?   Sensory: No sensory deficit.  ?   Motor: No weakness.  ?   Coordination: Coordination normal.  ?Psychiatric:     ?   Mood and Affect: Mood normal.     ?   Behavior: Behavior normal.     ?   Thought Content:  Thought content normal.     ?   Judgment: Judgment normal.  ? ? ?Procedures  ?Procedures ? ?ED Course / MDM  ?  ?Medical Decision Making ?Amount and/or Complexity of Data Reviewed ?Labs: ordered. ? ?Risk ?Prescription drug management. ? ? ?On assessment, patient undergoing second unit of PRBCs.  He is resting comfortably.  He remains anxious to go home.  Following infusion, he was discharged in good condition. ? ? ? ? ?  ?Godfrey Pick, MD ?02/15/22 [id number] ? ?

## 2022-02-14 NOTE — Telephone Encounter (Signed)
Pt has been contacted and told to go to ER ASAP. ?

## 2022-02-14 NOTE — ED Provider Notes (Signed)
?[facility]-EMERGENCY DEPT ?Provider Note ? ? ?CSN: [id number] ?Arrival date & time: 02/14/22  0954 ? ?  ? ?History ? ?Chief Complaint  ?Patient presents with  ? Abnormal Lab  ? ? ?Ricardo Dawson is a 76 y.o. male. ? ?HPI ?Patient presents at the behest of his outpatient clinic due to concern of anemia.  Patient denies medical problems, states that he has not ever been in the hospital before.  Yesterday he went for annual evaluation.  Today he was made aware of abnormal hemoglobin value.  He denies melena, hematemesis or other changes from baseline bowel movements.  He has no abdominal pain, no syncope, notes that he has lightheadedness, worse with activity, decreased ADL capacity as well. ?  ? ?Home Medications ?Prior to Admission medications   ?Medication Sig Start Date End Date Taking? Authorizing Provider  ?aspirin 81 MG tablet Take 81 mg by mouth daily.    [provider]  ?Cyanocobalamin (B-12) 1000 MCG CAPS Take 1 capsule by mouth daily. 09/24/20   Jarold Motto, PA  ?folic acid (FOLVITE) 1 MG tablet Take 1 tablet (1 mg total) by mouth daily. ?Patient not taking: Reported on 02/13/2022 09/24/20   Jarold Motto, PA  ?losartan-hydrochlorothiazide [facility]) 100-25 MG tablet Take 1 tablet by mouth daily 02/13/22   Jarold Motto, PA  ?simvastatin (ZOCOR) 10 MG tablet TAKE ONE TABLET BY MOUTH DAILY 02/13/22   Jarold Motto, PA  ?thiamine 100 MG tablet Take 1 tablet (100 mg total) by mouth daily. 09/24/20   Jarold Motto, PA  ?   ? ?Allergies    ?Ace inhibitors   ? ?Review of Systems   ?Review of Systems  ?Constitutional:   ?     Per HPI, otherwise negative  ?HENT:    ?     Per HPI, otherwise negative  ?Respiratory:    ?     Per HPI, otherwise negative  ?Cardiovascular:   ?     Per HPI, otherwise negative  ?Gastrointestinal:  Negative for vomiting.  ?Endocrine:  ?     Negative aside from HPI  ?Genitourinary:   ?     Neg aside from HPI   ?Musculoskeletal:   ?     Per HPI,  otherwise negative  ?Skin: Negative.   ?Neurological:  Negative for syncope.  ? ?Physical Exam ?Updated Vital Signs ?BP (!) 175/90 (BP Location: Right Arm)   Pulse (!) 114   Temp 98.5 ?F (36.9 ?C) (Oral)   Resp 18   Ht 5\' 8"  (1.727 m)   Wt 66.7 kg   SpO2 100%   BMI 22.35 kg/m?  ?Physical Exam ?Vitals and nursing note reviewed.  ?Constitutional:   ?   General: He is not in acute distress. ?   Appearance: He is well-developed.  ?HENT:  ?   Head: Normocephalic and atraumatic.  ?Eyes:  ?   Conjunctiva/sclera: Conjunctivae normal.  ?Cardiovascular:  ?   Rate and Rhythm: Normal rate and regular rhythm.  ?Pulmonary:  ?   Effort: Pulmonary effort is normal. No respiratory distress.  ?   Breath sounds: No stridor.  ?Abdominal:  ?   General: There is no distension.  ?Genitourinary: ?   Rectum: Guaiac result negative.  ?   Comments: Chaperone present rectum unremarkable ?Skin: ?   General: Skin is warm and dry.  ?Neurological:  ?   Mental Status: He is alert and oriented to person, place, and time.  ? ? ?ED Results / Procedures / Treatments   ?  Labs ?(all labs ordered are listed, but only abnormal results are displayed) ?Labs Reviewed  ?CBC WITH DIFFERENTIAL/PLATELET - Abnormal; Notable for the following components:  ?    Result Value  ? RBC 2.42 (*)   ? Hemoglobin 6.5 (*)   ? HCT 20.1 (*)   ? RDW 16.4 (*)   ? All other components within normal limits  ?COMPREHENSIVE METABOLIC PANEL - Abnormal; Notable for the following components:  ? Chloride 114 (*)   ? CO2 21 (*)   ? Glucose, Bld 161 (*)   ? All other components within normal limits  ?PROTIME-INR  ?APTT  ?POC OCCULT BLOOD, ED  ?TYPE AND SCREEN  ?ABO/RH  ?PREPARE RBC (CROSSMATCH)  ? ? ?EKG ?None ? ?Radiology ?No results found. ? ?Procedures ?Procedures  ? ? ?Medications Ordered in ED ?Medications  ?0.9 %  sodium chloride infusion (has no administration in time range)  ? ? ?ED Course/ Medical Decision Making/ A&P ?This patient with a Hx of lightheadedness questionable  anemia presents to the ED on referral from primary care.  Here the patient complains of lightheadedness, weakness this involves an extensive number of treatment options, and is a complaint that carries with it a high risk of complications and morbidity.   ? ?The differential diagnosis includes GI bleed, hemolytic anemia, vitamin deficiency, infection, malignancy ? ? ?Social Determinants of Health: ? ?Age ? ?Additional history obtained: ? ?Additional history and/or information obtained from urgent care note, notable for as above concern for anemia ? ? ?After the initial evaluation, orders, including: Labs were initiated. ? ? ?Patient placed on Cardiac and Pulse-Oximetry Monitors. ?The patient was maintained on a cardiac monitor.  The cardiac monitored showed an rhythm of 115 sinus tach abnormal ?The patient was also maintained on pulse oximetry. The readings were typically 100% room air normal ? ? ?On repeat evaluation of the patient stayed the same ? ?Lab Tests: ? ?I personally interpreted labs.  The pertinent results include: Hemoglobin 6.5 ?12:07 PM ?Patient aware of all findings, including critically abnormal hemoglobin value.  He and I discussed transfusion and he is amenable to this, states that he cannot spend the night due to family concerns. ? ?5:07 PM ?Patient smiling, receiving blood transfusion. ? ?Dispostion / Final MDM: ? ?After consideration of the diagnostic results and the patient's response to treatment, following transfusion patient is appropriate for discharge.  Adult male presents with lightheadedness, no pain, no syncope, no fall.  Patient found to have critically abnormal hemoglobin value, but Hemoccult negative status.  Some suspicion for vitamin deficiency anemia, and the patient was started on appropriate supplement.  With concern for critical anemia patient received blood transfusion, notes that he is comfortable following up as an outpatient for further monitoring, management. ? ?Final  Clinical Impression(s) / ED Diagnoses ?Final diagnoses:  ?Symptomatic anemia  ?CRITICAL CARE ?Performed by: Gerhard Munch ?Total critical care time: 35 minutes ?Critical care time was exclusive of separately billable procedures and treating other patients. ?Critical care was necessary to treat or prevent imminent or life-threatening deterioration. ?Critical care was time spent personally by me on the following activities: development of treatment plan with patient and/or surrogate as well as nursing, discussions with consultants, evaluation of patient's response to treatment, examination of patient, obtaining history from patient or surrogate, ordering and performing treatments and interventions, ordering and review of laboratory studies, ordering and review of radiographic studies, pulse oximetry and re-evaluation of patient's condition. ? ?  ?Gerhard Munch, MD ?02/14/22 1707 ? ?

## 2022-02-14 NOTE — ED Notes (Signed)
Pt care taken, blood infusing, no complaints at this time. ?

## 2022-02-14 NOTE — Discharge Instructions (Signed)
As discussed, with your anemia it is very important that he follow-up with your physician to discuss additional evaluation into possible causes.  In the interim, please monitor your condition carefully and do not hesitate to return here if you develop new, or concerning changes in your condition. ?

## 2022-02-14 NOTE — ED Triage Notes (Signed)
Per pt, states he had blood work done yesterday at [facility] they called him this am and told him to come to ED due to low Hgb, 6.4-states he has been having low energy for about 1 month-states he has not noticed any bleeding ?

## 2022-02-15 LAB — BPAM RBC
Blood Product Expiration Date: 202304272359
Blood Product Expiration Date: 202305012359
ISSUE DATE / TIME: 202304041336
ISSUE DATE / TIME: 202304041721
Unit Type and Rh: 9500
Unit Type and Rh: 9500

## 2022-02-15 LAB — TYPE AND SCREEN
ABO/RH(D): O NEG
Antibody Screen: NEGATIVE
Unit division: 0
Unit division: 0

## 2022-02-17 LAB — VITAMIN B1: Vitamin B1 (Thiamine): 139 nmol/L — ABNORMAL HIGH (ref 8–30)

## 2022-02-22 ENCOUNTER — Encounter: Payer: Self-pay | Admitting: Physician Assistant

## 2022-02-22 ENCOUNTER — Ambulatory Visit (INDEPENDENT_AMBULATORY_CARE_PROVIDER_SITE_OTHER): Payer: Medicare HMO | Admitting: Physician Assistant

## 2022-02-22 VITALS — BP 118/70 | HR 68 | Temp 98.3°F | Ht 68.0 in | Wt 143.4 lb

## 2022-02-22 DIAGNOSIS — D649 Anemia, unspecified: Secondary | ICD-10-CM | POA: Diagnosis not present

## 2022-02-22 LAB — CBC WITH DIFFERENTIAL/PLATELET
Basophils Absolute: 0 10*3/uL (ref 0.0–0.1)
Basophils Relative: 0.6 % (ref 0.0–3.0)
Eosinophils Absolute: 0 10*3/uL (ref 0.0–0.7)
Eosinophils Relative: 0.5 % (ref 0.0–5.0)
HCT: 32.4 % — ABNORMAL LOW (ref 39.0–52.0)
Hemoglobin: 10.6 g/dL — ABNORMAL LOW (ref 13.0–17.0)
Lymphocytes Relative: 17.6 % (ref 12.0–46.0)
Lymphs Abs: 1.1 10*3/uL (ref 0.7–4.0)
MCHC: 32.7 g/dL (ref 30.0–36.0)
MCV: 81.9 fl (ref 78.0–100.0)
Monocytes Absolute: 0.6 10*3/uL (ref 0.1–1.0)
Monocytes Relative: 10.4 % (ref 3.0–12.0)
Neutro Abs: 4.2 10*3/uL (ref 1.4–7.7)
Neutrophils Relative %: 70.9 % (ref 43.0–77.0)
Platelets: 337 10*3/uL (ref 150.0–400.0)
RBC: 3.96 Mil/uL — ABNORMAL LOW (ref 4.22–5.81)
RDW: 19.5 % — ABNORMAL HIGH (ref 11.5–15.5)
WBC: 6 10*3/uL (ref 4.0–10.5)

## 2022-02-22 LAB — IBC + FERRITIN
Ferritin: 15.5 ng/mL — ABNORMAL LOW (ref 22.0–322.0)
Iron: 45 ug/dL (ref 42–165)
Saturation Ratios: 9.7 % — ABNORMAL LOW (ref 20.0–50.0)
TIBC: 464.8 ug/dL — ABNORMAL HIGH (ref 250.0–450.0)
Transferrin: 332 mg/dL (ref 212.0–360.0)

## 2022-02-22 MED ORDER — FOLIC ACID 1 MG PO TABS: 1.0000 mg | ORAL_TABLET | Freq: Every day | ORAL | 3 refills | Status: DC

## 2022-02-22 NOTE — Progress Notes (Signed)
Ricardo Dawson is a 76 y.o. male here for a ED F/U. ? ? ?History of Present Illness:  ? ?Chief Complaint  ?Patient presents with  ? f/u from ED  ?  Pt denies dizziness or lightheadedness.  ? ? ?HPI ? ?ED F/U of Anemia ?On 02/14/22, Ricardo Dawson presented to the ED per my recommendation following an abnormal RBC of 2.40, hemoglobin of 6.4 and HCT of 19.7 being found on his routine lab work. Upon further evaluation, pt admitted he had been experiencing lightheadedness that worsened upon activity and decreased ADL capacity. Denied melena, hematemesis, abdominal pain, syncope, or changes from baseline bowel movements. Due to nature of sx, pt did undergo a rectal exam, CBC, and CMP. While the rectal exam was unremarkable, pt was found to have a hemoglobin of 6.5 but hemoccult negative status. Although pt was not agreeable to being admitted. ? ?Since he seemingly responded to this treatment, he was discharged with a vitamin D supplement due to suspicion of vitamin D deficiency. Ricardo Dawson was also recommended to follow up with his PCP for further evaluation.  ? ?Currently Ricardo Dawson states that since being discharged, he has been compliant with taking a multivitamin daily and B12 1000 mg daily with no complications. Reports that he has been feeling great and has noticed an improvement in his energy levels. Pt is planning to follow up with gastroenterology for further evaluation. He is also interested in having his blood re-check today. Denies dizziness, lightheadedness. ? ? ? ?Past Medical History:  ?Diagnosis Date  ? Diabetes mellitus   ? Type II  ? Hyperlipidemia   ? Hypertension   ? ?  ?Social History  ? ?Tobacco Use  ? Smoking status: Every Day  ?  Types: Cigars  ?  Start date: 2021  ? Smokeless tobacco: Never  ?Vaping Use  ? Vaping Use: Never used  ?Substance Use Topics  ? Alcohol use: Yes  ?  Comment: Rare  ? Drug use: No  ? ? ?History reviewed. No pertinent surgical history. ? ?Family History  ?Problem Relation Age of Onset   ? Cancer Neg Hx   ? Colon cancer Neg Hx   ? Stomach cancer Neg Hx   ? Pancreatic cancer Neg Hx   ? ? ?Allergies  ?Allergen Reactions  ? Ace Inhibitors Other (See Comments) and Cough  ?  Cough 2002--altace  ? ? ?Current Medications:  ? ?Current Outpatient Medications:  ?  aspirin 81 MG tablet, Take 81 mg by mouth daily., Disp: , Rfl:  ?  Cyanocobalamin (B-12) 1000 MCG CAPS, Take 1 capsule by mouth daily., Disp: 90 capsule, Rfl: 3 ?  losartan-hydrochlorothiazide (HYZAAR) 100-25 MG tablet, Take 1 tablet by mouth daily, Disp: 90 tablet, Rfl: 1 ?  Multiple Vitamin (MULTIVITAMIN) tablet, Take 1 tablet by mouth daily with breakfast., Disp: , Rfl:  ?  simvastatin (ZOCOR) 10 MG tablet, TAKE ONE TABLET BY MOUTH DAILY, Disp: 90 tablet, Rfl: 1 ?  folic acid (FOLVITE) 1 MG tablet, Take 1 tablet (1 mg total) by mouth daily., Disp: 90 tablet, Rfl: 3  ? ?Review of Systems:  ? ?ROS ?Negative unless otherwise specified per HPI. ?Vitals:  ? ?Vitals:  ? 02/22/22 1040  ?BP: 118/70  ?Pulse: 68  ?Temp: 98.3 ?F (36.8 ?C)  ?TempSrc: Temporal  ?SpO2: 97%  ?Weight: 143 lb 6.1 oz (65 kg)  ?Height: 5\' 8"  (1.727 m)  ?   ?Body mass index is 21.8 kg/m?. ? ?Physical Exam:  ? ?Physical Exam ?Vitals and nursing  note reviewed.  ?Constitutional:   ?   General: He is not in acute distress. ?   Appearance: He is well-developed. He is not ill-appearing or toxic-appearing.  ?Cardiovascular:  ?   Rate and Rhythm: Normal rate and regular rhythm.  ?   Pulses: Normal pulses.  ?   Heart sounds: Normal heart sounds, S1 normal and S2 normal.  ?Pulmonary:  ?   Effort: Pulmonary effort is normal.  ?   Breath sounds: Normal breath sounds.  ?Skin: ?   General: Skin is warm and dry.  ?Neurological:  ?   Mental Status: He is alert.  ?   GCS: GCS eye subscore is 4. GCS verbal subscore is 5. GCS motor subscore is 6.  ?Psychiatric:     ?   Speech: Speech normal.     ?   Behavior: Behavior normal. Behavior is cooperative.  ? ? ?Assessment and Plan:  ? ?Anemia, unspecified  type ?Update labs today, will make recommendations accordingly ?Discussed need to determine source of blood loss and importance of following up with gastroenterology for further evaluation of anemia-- patient verbalized understanding ?Low threshold to return to ER for low Hgb or any symptoms in the interim ? ?I,Ricardo Dawson,acting as a scribe for [employer], PA.,have documented all relevant documentation on the behalf of Ricardo Motto, PA,as directed by  Ricardo Motto, PA while in the presence of Ricardo Dawson, [place]. ? ?IJarold Motto, PA, have reviewed all documentation for this visit. The documentation on 02/22/22 for the exam, diagnosis, procedures, and orders are all accurate and complete. ? ?Time spent with patient today was 25 minutes which consisted of chart review, review of ED notes and explaining/discussing diagnosis, work up, treatment answering questions and documentation. ? ? ?Ricardo Motto, PA-C ? ?

## 2022-02-22 NOTE — Patient Instructions (Signed)
It was great to see you! ? ?CALL THE GASTROENTEROLOGIST!!!!! ? ?I have sent in your folic acid. ? ?We will call you with your blood work results. ? ?Take care, ? ?Jarold Motto PA-C  ?

## 2022-02-23 ENCOUNTER — Telehealth: Payer: Self-pay | Admitting: Physician Assistant

## 2022-02-23 NOTE — Telephone Encounter (Signed)
Pt called in to check on his lab results. He stated we will not answer the phone to number he does not know but will try and call us back this afternoon.  ?

## 2022-02-24 ENCOUNTER — Telehealth: Payer: Self-pay | Admitting: Physician Assistant

## 2022-02-24 NOTE — Telephone Encounter (Signed)
See result notes. 

## 2022-02-24 NOTE — Telephone Encounter (Signed)
Pt calling back to the office, per request from provider, to confirm that he made an appointment with Dr. Modena Nunnery: May 12th. ?

## 2022-02-24 NOTE — Telephone Encounter (Signed)
Samantha notified. 

## 2022-03-24 ENCOUNTER — Ambulatory Visit: Payer: Medicare HMO | Admitting: Gastroenterology

## 2022-04-20 ENCOUNTER — Ambulatory Visit: Payer: Medicare HMO | Admitting: Gastroenterology

## 2022-04-27 ENCOUNTER — Telehealth: Payer: Self-pay | Admitting: Physician Assistant

## 2022-04-27 NOTE — Telephone Encounter (Signed)
Copied from CRM [id number]. Topic: Medicare AWV >> Apr 27, 2022 11:05 AM Payton Doughty wrote: LeftReason for [id number] patient to schedule Annual Wellness Visit.  Please schedule with Nurse Health Advisor Denisa O'Brien-Blaney, LPN at [facility].  Please call [phone] ask for Olegario Messier Reason for CRM: No answer

## 2022-05-19 ENCOUNTER — Ambulatory Visit (INDEPENDENT_AMBULATORY_CARE_PROVIDER_SITE_OTHER): Payer: Medicare HMO

## 2022-05-19 DIAGNOSIS — Z0000 Encounter for general adult medical examination without abnormal findings: Secondary | ICD-10-CM

## 2022-05-19 NOTE — Patient Instructions (Signed)
[name] , Thank you for taking time to come for your Medicare Wellness Visit. I appreciate your ongoing commitment to your health goals. Please review the following plan we discussed and let me know if I can assist you in the future.   Screening recommendations/referrals: Colonoscopy: no longer required  Recommended yearly ophthalmology/optometry visit for glaucoma screening and checkup Recommended yearly dental visit for hygiene and checkup  Vaccinations: Influenza vaccine: due  Pneumococcal vaccine: Up to date Tdap vaccine: due and discussed  Shingles vaccine: Shingrix discussed. Please contact your pharmacy for coverage information.    Covid-19: Completed 3/7, 02/17/20  Advanced directives: Please bring a copy of your health care power of attorney and living will to the office at your convenience.  Conditions/risks identified: stay healthy  Next appointment: Follow up in one year for your annual wellness visit.   Preventive Care 77 Years and Older, Male Preventive care refers to lifestyle choices and visits with your health care provider that can promote health and wellness. What does preventive care include? A yearly physical exam. This is also called an annual well check. Dental exams once or twice a year. Routine eye exams. Ask your health care provider how often you should have your eyes checked. Personal lifestyle choices, including: Daily care of your teeth and gums. Regular physical activity. Eating a healthy diet. Avoiding tobacco and drug use. Limiting alcohol use. Practicing safe sex. Taking low doses of aspirin every day. Taking vitamin and mineral supplements as recommended by your health care provider. What happens during an annual well check? The services and screenings done by your health care provider during your annual well check will depend on your age, overall health, lifestyle risk factors, and family history of disease. Counseling  Your health care  provider may ask you questions about your: Alcohol use. Tobacco use. Drug use. Emotional well-being. Home and relationship well-being. Sexual activity. Eating habits. History of falls. Memory and ability to understand (cognition). Work and work Astronomer. Screening  You may have the following tests or measurements: Height, weight, and BMI. Blood pressure. Lipid and cholesterol levels. These may be checked every 5 years, or more frequently if you are over 32 years old. Skin check. Lung cancer screening. You may have this screening every year starting at age 77 if you have a 30-pack-year history of smoking and currently smoke or have quit within the past 15 years. Fecal occult blood test (FOBT) of the stool. You may have this test every year starting at age 77. Flexible sigmoidoscopy or colonoscopy. You may have a sigmoidoscopy every 5 years or a colonoscopy every 10 years starting at age 20. Prostate cancer screening. Recommendations will vary depending on your family history and other risks. Hepatitis C blood test. Hepatitis B blood test. Sexually transmitted disease (STD) testing. Diabetes screening. This is done by checking your blood sugar (glucose) after you have not eaten for a while (fasting). You may have this done every 1-3 years. Abdominal aortic aneurysm (AAA) screening. You may need this if you are a current or former smoker. Osteoporosis. You may be screened starting at age 35 if you are at high risk. Talk with your health care provider about your test results, treatment options, and if necessary, the need for more tests. Vaccines  Your health care provider may recommend certain vaccines, such as: Influenza vaccine. This is recommended every year. Tetanus, diphtheria, and acellular pertussis (Tdap, Td) vaccine. You may need a Td booster every 10 years. Zoster vaccine. You may  need this after age 38. Pneumococcal 13-valent conjugate (PCV13) vaccine. One dose is  recommended after age 8. Pneumococcal polysaccharide (PPSV23) vaccine. One dose is recommended after age 24. Talk to your health care provider about which screenings and vaccines you need and how often you need them. This information is not intended to replace advice given to you by your health care provider. Make sure you discuss any questions you have with your health care provider. Document Released: 11/26/2015 Document Revised: 07/19/2016 Document Reviewed: 08/31/2015 Elsevier Interactive Patient Education  2017 [employer] Prevention in the Home Falls can cause injuries. They can happen to people of all ages. There are many things you can do to make your home safe and to help prevent falls. What can I do on the outside of my home? Regularly fix the edges of walkways and driveways and fix any cracks. Remove anything that might make you trip as you walk through a door, such as a raised step or threshold. Trim any bushes or trees on the path to your home. Use bright outdoor lighting. Clear any walking paths of anything that might make someone trip, such as rocks or tools. Regularly check to see if handrails are loose or broken. Make sure that both sides of any steps have handrails. Any raised decks and porches should have guardrails on the edges. Have any leaves, snow, or ice cleared regularly. Use sand or salt on walking paths during winter. Clean up any spills in your garage right away. This includes oil or grease spills. What can I do in the bathroom? Use night lights. Install grab bars by the toilet and in the tub and shower. Do not use towel bars as grab bars. Use non-skid mats or decals in the tub or shower. If you need to sit down in the shower, use a plastic, non-slip stool. Keep the floor dry. Clean up any water that spills on the floor as soon as it happens. Remove soap buildup in the tub or shower regularly. Attach bath mats securely with double-sided non-slip rug  tape. Do not have throw rugs and other things on the floor that can make you trip. What can I do in the bedroom? Use night lights. Make sure that you have a light by your bed that is easy to reach. Do not use any sheets or blankets that are too big for your bed. They should not hang down onto the floor. Have a firm chair that has side arms. You can use this for support while you get dressed. Do not have throw rugs and other things on the floor that can make you trip. What can I do in the kitchen? Clean up any spills right away. Avoid walking on wet floors. Keep items that you use a lot in easy-to-reach places. If you need to reach something above you, use a strong step stool that has a grab bar. Keep electrical cords out of the way. Do not use floor polish or wax that makes floors slippery. If you must use wax, use non-skid floor wax. Do not have throw rugs and other things on the floor that can make you trip. What can I do with my stairs? Do not leave any items on the stairs. Make sure that there are handrails on both sides of the stairs and use them. Fix handrails that are broken or loose. Make sure that handrails are as long as the stairways. Check any carpeting to make sure that it is firmly attached  to the stairs. Fix any carpet that is loose or worn. Avoid having throw rugs at the top or bottom of the stairs. If you do have throw rugs, attach them to the floor with carpet tape. Make sure that you have a light switch at the top of the stairs and the bottom of the stairs. If you do not have them, ask someone to add them for you. What else can I do to help prevent falls? Wear shoes that: Do not have high heels. Have rubber bottoms. Are comfortable and fit you well. Are closed at the toe. Do not wear sandals. If you use a stepladder: Make sure that it is fully opened. Do not climb a closed stepladder. Make sure that both sides of the stepladder are locked into place. Ask someone to  hold it for you, if possible. Clearly mark and make sure that you can see: Any grab bars or handrails. First and last steps. Where the edge of each step is. Use tools that help you move around (mobility aids) if they are needed. These include: Canes. Walkers. Scooters. Crutches. Turn on the lights when you go into a dark area. Replace any light bulbs as soon as they burn out. Set up your furniture so you have a clear path. Avoid moving your furniture around. If any of your floors are uneven, fix them. If there are any pets around you, be aware of where they are. Review your medicines with your doctor. Some medicines can make you feel dizzy. This can increase your chance of falling. Ask your doctor what other things that you can do to help prevent falls. This information is not intended to replace advice given to you by your health care provider. Make sure you discuss any questions you have with your health care provider. Document Released: 08/26/2009 Document Revised: 04/06/2016 Document Reviewed: 12/04/2014 Elsevier Interactive Patient Education  2017 [employer].

## 2022-05-19 NOTE — Progress Notes (Signed)
Virtual Visit via Telephone Note  I connected with  Ricardo Dawson on 05/19/22 at  1:45 PM EDT by telephone and verified that I am speaking with the correct person using two identifiers.  Medicare Annual Wellness visit completed telephonically due to Covid-19 pandemic.   Persons participating in this call: This Health Coach and this patient.   Location: Patient: Home Provider: Office    I discussed the limitations, risks, security and privacy concerns of performing an evaluation and management service by telephone and the availability of in person appointments. The patient expressed understanding and agreed to proceed.  Unable to perform video visit due to video visit attempted and failed and/or patient does not have video capability.   Some vital signs may be absent or patient reported.   Willette Brace, LPN   Subjective:   Ricardo Dawson is a 76 y.o. male who presents for Medicare Annual/Subsequent preventive examination.  Review of Systems     Cardiac Risk Factors include: advanced age (>68men, >75 women);diabetes mellitus;hypertension;dyslipidemia;male gender     Objective:    There were no vitals filed for this visit. There is no height or weight on file to calculate BMI.     05/19/2022    1:24 PM 12/09/2020   10:47 AM 04/17/2018   10:29 AM 04/17/2017   11:30 AM 01/24/2016   11:39 AM 01/22/2015    9:03 AM  Advanced Directives  Does Patient Have a Medical Advance Directive? Yes No No No No No  Type of Advance Directive Grill in Chart? No - copy requested       Would patient like information on creating a medical advance directive?  No - Patient declined        Current Medications (verified) Outpatient Encounter Medications as of 05/19/2022  Medication Sig   aspirin 81 MG tablet Take 81 mg by mouth daily.   Cyanocobalamin (B-12) 1000 MCG CAPS Take 1 capsule by mouth daily.   folic acid (FOLVITE) 1 MG  tablet Take 1 tablet (1 mg total) by mouth daily.   losartan-hydrochlorothiazide (HYZAAR) 100-25 MG tablet Take 1 tablet by mouth daily   Multiple Vitamin (MULTIVITAMIN) tablet Take 1 tablet by mouth daily with breakfast.   simvastatin (ZOCOR) 10 MG tablet TAKE ONE TABLET BY MOUTH DAILY   No facility-administered encounter medications on file as of 05/19/2022.    Allergies (verified) Ace inhibitors   History: Past Medical History:  Diagnosis Date   Diabetes mellitus    Type II   Hyperlipidemia    Hypertension    History reviewed. No pertinent surgical history. Family History  Problem Relation Age of Onset   Cancer Neg Hx    Colon cancer Neg Hx    Stomach cancer Neg Hx    Pancreatic cancer Neg Hx    Social History   Socioeconomic History   Marital status: Married    Spouse name: Not on file   Number of children: Not on file   Years of education: Not on file   Highest education level: Not on file  Occupational History   Occupation: retired  Tobacco Use   Smoking status: Every Day    Types: Cigars    Start date: 2021   Smokeless tobacco: Never  Vaping Use   Vaping Use: Never used  Substance and Sexual Activity   Alcohol use: Yes    Comment: Rare   Drug use:  No   Sexual activity: Yes  Other Topics Concern   Not on file  Social History Narrative   Janitorial -- retired   One son -- lives in [place] to watch the Ricardo Dawson is Right; baseball, football   Social Determinants of Health   Financial Resource Strain: Low Risk  (05/19/2022)   Overall Financial Resource Strain (CARDIA)    Difficulty of Paying Living Expenses: Not hard at all  Food Insecurity: No Food Insecurity (05/19/2022)   Hunger Vital Sign    Worried About Running Out of Food in the Last Year: Never true    Ran Out of Food in the Last Year: Never true  Transportation Needs: No Transportation Needs (05/19/2022)   PRAPARE - Administrator, Civil Service (Medical): No    Lack of  Transportation (Non-Medical): No  Physical Activity: Sufficiently Active (05/19/2022)   Exercise Vital Sign    Days of Exercise per Week: 5 days    Minutes of Exercise per Session: 30 min  Stress: No Stress Concern Present (05/19/2022)   [employer] of Occupational Health - Occupational Stress Questionnaire    Feeling of Stress : Not at all  Social Connections: Moderately Integrated (05/19/2022)   Social Connection and Isolation Panel [NHANES]    Frequency of Communication with Friends and Family: Once a week    Frequency of Social Gatherings with Friends and Family: Three times a week    Attends Religious Services: More than 4 times per year    Active Member of Clubs or Organizations: No    Attends Banker Meetings: Never    Marital Status: Married    Tobacco Counseling Ready to quit: Not Answered Counseling given: Not Answered   Clinical Intake:  Pre-visit preparation completed: Yes  Pain : No/denies pain     BMI - recorded: 21.81 Nutritional Status: BMI of 19-24  Normal Nutritional Risks: None Diabetes: Yes CBG done?: No Did pt. bring in CBG monitor from home?: No  How often do you need to have someone help you when you read instructions, pamphlets, or other written materials from your doctor or pharmacy?: 1 - Never  Diabetic?Nutrition Risk Assessment:  Has the patient had any N/V/D within the last 2 months?  No  Does the patient have any non-healing wounds?  No  Has the patient had any unintentional weight loss or weight gain?  No   Diabetes:  Is the patient diabetic?  Yes  If diabetic, was a CBG obtained today?  Yes  Did the patient bring in their glucometer from home?  No  How often do you monitor your CBG's? daily.   Financial Strains and Diabetes Management:  Are you having any financial strains with the device, your supplies or your medication? No .  Does the patient want to be seen by Chronic Care Management for management of their  diabetes?  No  Would the patient like to be referred to a Nutritionist or for Diabetic Management?  No   Diabetic Exams:  Diabetic Eye Exam: Completed 11/27/21 Diabetic Foot Exam: Completed 03/12/20   Interpreter Needed?: No  Information entered by :: Lanier Ensign, LPN   Activities of Daily Living    05/19/2022    1:25 PM  In your present state of health, do you have any difficulty performing the following activities:  Hearing? 0  Vision? 0  Difficulty concentrating or making decisions? 0  Walking or climbing stairs? 0  Dressing or bathing? 0  Doing errands, shopping? 0  Preparing Food and eating ? N  Using the Toilet? N  In the past six months, have you accidently leaked urine? N  Do you have problems with loss of bowel control? N  Managing your Medications? N  Managing your Finances? N  Housekeeping or managing your Housekeeping? N    Patient Care Team: Jarold Motto, [place] as PCP - General (Physician Assistant) Ernesto Rutherford, MD as Consulting Physician (Ophthalmology)  Indicate any recent Medical Services you may have received from other than [facility] providers in the past year (date may be approximate).     Assessment:   This is a routine wellness examination for [place].  Hearing/Vision screen Hearing Screening - Comments:: Pt denies any hearing issues  Vision Screening - Comments:: Pt follows upw ith Dr Dione Booze for annual eye exams   Dietary issues and exercise activities discussed: Current Exercise Habits: Home exercise routine, Type of exercise: walking, Time (Minutes): 30, Frequency (Times/Week): 5, Weekly Exercise (Minutes/Week): 150   Goals Addressed             This Visit's Progress    Patient Stated       Stay healthy        Depression Screen    05/19/2022    1:23 PM 02/13/2022    1:24 PM 12/09/2020   10:45 AM 03/12/2020    1:04 PM 01/22/2015    8:31 AM  PHQ 2/9 Scores  PHQ - 2 Score 0 0 0 0 0    Fall Risk    05/19/2022    1:25 PM 02/13/2022     1:26 PM 12/09/2020   10:48 AM 03/12/2020    1:04 PM 01/22/2015    8:31 AM  Fall Risk   Falls in the past year? 0 0 0 0 No  Number falls in past yr: 0 0 1    Comment   on ice shoveling snow    Injury with Fall? 0 0 0    Risk for fall due to : Impaired vision No Fall Risks Impaired vision    Follow up Falls prevention discussed Falls evaluation completed Falls prevention discussed      FALL RISK PREVENTION PERTAINING TO THE HOME:  Any stairs in or around the home? No  If so, are there any without handrails? No  Home free of loose throw rugs in walkways, pet beds, electrical cords, etc? Yes  Adequate lighting in your home to reduce risk of falls? Yes   ASSISTIVE DEVICES UTILIZED TO PREVENT FALLS:  Life alert? No  Use of a cane, walker or w/c? No  Grab bars in the bathroom? Yes  Shower chair or bench in shower? No  Elevated toilet seat or a handicapped toilet? No   TIMED UP AND GO:  Was the test performed? No .   Cognitive Function:        05/19/2022    1:26 PM 12/09/2020   11:04 AM  6CIT Screen  What Year? 0 points 0 points  What month? 0 points 0 points  What time? 0 points   Count back from 20 0 points 0 points  Months in reverse 0 points 0 points  Repeat phrase 0 points 2 points  Total Score 0 points     Immunizations Immunization History  Administered Date(s) Administered   Influenza Whole 09/04/2008, 12/09/2009   Influenza, High Dose Seasonal PF 10/06/2015   Influenza,inj,Quad PF,6+ Mos 01/22/2015, 07/26/2016, 10/17/2017   PFIZER(Purple Top)SARS-COV-2 Vaccination 01/18/2020, 02/17/2020  Pneumococcal Conjugate-13 01/22/2015   Pneumococcal Polysaccharide-23 03/06/2011   Td 09/04/2008    TDAP status: Due, Education has been provided regarding the importance of this vaccine. Advised may receive this vaccine at local pharmacy or Health Dept. Aware to provide a copy of the vaccination record if obtained from local pharmacy or Health Dept. Verbalized acceptance and  understanding.  Flu Vaccine status: Due, Education has been provided regarding the importance of this vaccine. Advised may receive this vaccine at local pharmacy or Health Dept. Aware to provide a copy of the vaccination record if obtained from local pharmacy or Health Dept. Verbalized acceptance and understanding.  Pneumococcal vaccine status: Up to date  Covid-19 vaccine status: Completed vaccines  Qualifies for Shingles Vaccine? Yes   Zostavax completed No   Shingrix Completed?: No.    Education has been provided regarding the importance of this vaccine. Patient has been advised to call insurance company to determine out of pocket expense if they have not yet received this vaccine. Advised may also receive vaccine at local pharmacy or Health Dept. Verbalized acceptance and understanding.  Screening Tests Health Maintenance  Topic Date Due   Zoster Vaccines- Shingrix (1 of 2) Never done   COVID-19 Vaccine (3 - Pfizer series) 04/13/2020   TETANUS/TDAP  02/23/2023 (Originally 09/04/2018)   INFLUENZA VACCINE  06/13/2022   HEMOGLOBIN A1C  08/15/2022   OPHTHALMOLOGY EXAM  11/27/2022   Pneumonia Vaccine 26+ Years old  Completed   Hepatitis C Screening  Completed   HPV VACCINES  Aged Out   FOOT EXAM  Discontinued   COLONOSCOPY (Pts 45-69yrs Insurance coverage will need to be confirmed)  Discontinued    Health Maintenance  Health Maintenance Due  Topic Date Due   Zoster Vaccines- Shingrix (1 of 2) Never done   COVID-19 Vaccine (3 - Pfizer series) 04/13/2020    Colorectal cancer screening: No longer required.    Additional Screening:  Hepatitis C Screening:  Completed 10/17/17  Vision Screening: Recommended annual ophthalmology exams for early detection of glaucoma and other disorders of the eye. Is the patient up to date with their annual eye exam?  Yes  Who is the provider or what is the name of the office in which the patient attends annual eye exams? Dr Dione Booze  If pt is not  established with a provider, would they like to be referred to a provider to establish care? No .   Dental Screening: Recommended annual dental exams for proper oral hygiene  Community Resource Referral / Chronic Care Management: CRR required this visit?  No   CCM required this visit?  No      Plan:     I have personally reviewed and noted the following in the patient's chart:   Medical and social history Use of alcohol, tobacco or illicit drugs  Current medications and supplements including opioid prescriptions. Patient is not currently taking opioid prescriptions. Functional ability and status Nutritional status Physical activity Advanced directives List of other physicians Hospitalizations, surgeries, and ER visits in previous 12 months Vitals Screenings to include cognitive, depression, and falls Referrals and appointments  In addition, I have reviewed and discussed with patient certain preventive protocols, quality metrics, and best practice recommendations. A written personalized care plan for preventive services as well as general preventive health recommendations were provided to patient.     Marzella Schlein, LPN   01/19/1016   Nurse Notes: None

## 2022-05-22 ENCOUNTER — Telehealth: Payer: Self-pay

## 2022-05-22 ENCOUNTER — Telehealth: Payer: Self-pay | Admitting: Physician Assistant

## 2022-05-22 NOTE — Telephone Encounter (Signed)
Pt states he had AWV on 05/19/22 and wanted to follow up.   States nurse mentioned B12 but patient states he is taking B1.   Patient requests a call back regarding which viatmin is "best suited for him".   Pt states, because of so many scam phone calls, the best way to get a pick up is to let it ring once, hang up, then call back again.

## 2022-05-23 NOTE — Telephone Encounter (Signed)
Spoke to pt told him Lelon Mast said, according to your eye doctor you should be on lifelong Thiamin, B12 and Folic acid. Pt verbalized understanding and said he is taking Folic acid but was not sure what other vitamins. He said he will go pick them up today. Told him okay.

## 2022-08-15 ENCOUNTER — Other Ambulatory Visit: Payer: Self-pay | Admitting: Physician Assistant

## 2022-11-14 ENCOUNTER — Other Ambulatory Visit: Payer: Self-pay | Admitting: Physician Assistant

## 2022-11-16 ENCOUNTER — Other Ambulatory Visit: Payer: Self-pay | Admitting: *Deleted

## 2022-11-16 MED ORDER — LOSARTAN POTASSIUM-HCTZ 100-25 MG PO TABS: 1.0000 | ORAL_TABLET | Freq: Every day | ORAL | 0 refills | Status: DC

## 2022-11-16 MED ORDER — SIMVASTATIN 10 MG PO TABS: 10.0000 mg | ORAL_TABLET | Freq: Every day | ORAL | 0 refills | Status: DC

## 2023-02-12 ENCOUNTER — Other Ambulatory Visit: Payer: Self-pay | Admitting: Physician Assistant

## 2023-05-08 ENCOUNTER — Ambulatory Visit (INDEPENDENT_AMBULATORY_CARE_PROVIDER_SITE_OTHER): Payer: Medicare PPO

## 2023-05-08 VITALS — BP 126/78 | HR 103 | Temp 98.1°F | Wt 145.0 lb

## 2023-05-08 DIAGNOSIS — Z0000 Encounter for general adult medical examination without abnormal findings: Secondary | ICD-10-CM | POA: Diagnosis not present

## 2023-05-08 NOTE — Progress Notes (Addendum)
Subjective:   Ricardo Dawson is a 77 y.o. male who presents for Medicare Annual/Subsequent preventive examination.  Visit Complete: In person   Review of Systems     Cardiac Risk Factors include: advanced age (>69men, >46 women);diabetes mellitus;dyslipidemia;hypertension;male gender;smoking/ tobacco exposure     Objective:    Today's Vitals   05/08/23 1049  BP: 126/78  Pulse: (!) 103  Temp: 98.1 F (36.7 C)  SpO2: 96%  Weight: 145 lb (65.8 kg)   Body mass index is 22.05 kg/m.     05/08/2023   10:59 AM 05/19/2022    1:24 PM 12/09/2020   10:47 AM 04/17/2018   10:29 AM 04/17/2017   11:30 AM 01/24/2016   11:39 AM 01/22/2015    9:03 AM  Advanced Directives  Does Patient Have a Medical Advance Directive? No Yes No No No No No  Type of Energy manager of Healthcare Power of Attorney in Chart?  No - copy requested       Would patient like information on creating a medical advance directive? No - Patient declined  No - Patient declined        Current Medications (verified) Outpatient Encounter Medications as of 05/08/2023  Medication Sig   aspirin 81 MG tablet Take 81 mg by mouth daily.   Cyanocobalamin (B-12) 1000 MCG CAPS Take 1 capsule by mouth daily.   folic acid (FOLVITE) 1 MG tablet Take 1 tablet by mouth once daily   losartan-hydrochlorothiazide (HYZAAR) 100-25 MG tablet Take 1 tablet by mouth daily.   Multiple Vitamin (MULTIVITAMIN) tablet Take 1 tablet by mouth daily with breakfast.   simvastatin (ZOCOR) 10 MG tablet Take 1 tablet (10 mg total) by mouth daily.   No facility-administered encounter medications on file as of 05/08/2023.    Allergies (verified) Ace inhibitors   History: Past Medical History:  Diagnosis Date   Diabetes mellitus    Type II   Hyperlipidemia    Hypertension    History reviewed. No pertinent surgical history. Family History  Problem Relation Age of Onset   Cancer Neg Hx    Colon cancer  Neg Hx    Stomach cancer Neg Hx    Pancreatic cancer Neg Hx    Social History   Socioeconomic History   Marital status: Married    Spouse name: Not on file   Number of children: Not on file   Years of education: Not on file   Highest education level: Not on file  Occupational History   Occupation: retired  Tobacco Use   Smoking status: Every Day    Types: Cigars    Start date: 2021   Smokeless tobacco: Never  Vaping Use   Vaping Use: Never used  Substance and Sexual Activity   Alcohol use: Yes    Comment: Rare   Drug use: No   Sexual activity: Yes  Other Topics Concern   Not on file  Social History Narrative   Janitorial -- retired   One son -- lives in [place] to watch the Samuella Cota is Right; baseball, football   Social Determinants of Health   Financial Resource Strain: Low Risk  (05/08/2023)   Overall Financial Resource Strain (CARDIA)    Difficulty of Paying Living Expenses: Not hard at all  Food Insecurity: No Food Insecurity (05/08/2023)   Hunger Vital Sign    Worried About Running Out of Food in the Last Year:  Never true    Ran Out of Food in the Last Year: Never true  Transportation Needs: No Transportation Needs (05/08/2023)   PRAPARE - Administrator, Civil Service (Medical): No    Lack of Transportation (Non-Medical): No  Physical Activity: Sufficiently Active (05/08/2023)   Exercise Vital Sign    Days of Exercise per Week: 5 days    Minutes of Exercise per Session: 90 min  Stress: No Stress Concern Present (05/08/2023)   [employer] of Occupational Health - Occupational Stress Questionnaire    Feeling of Stress : Not at all  Social Connections: Moderately Integrated (05/08/2023)   Social Connection and Isolation Panel [NHANES]    Frequency of Communication with Friends and Family: Once a week    Frequency of Social Gatherings with Friends and Family: More than three times a week    Attends Religious Services: More than 4 times per year     Active Member of [employer] or Organizations: No    Attends Engineer, structural: Never    Marital Status: Married    Tobacco Counseling Ready to quit: No Counseling given: Yes   Clinical Intake:  Pre-visit preparation completed: Yes  Pain : No/denies pain     Nutritional Status: BMI of 19-24  Normal Nutritional Risks: None Diabetes: Yes CBG done?: No Did pt. bring in CBG monitor from home?: No  How often do you need to have someone help you when you read instructions, pamphlets, or other written materials from your doctor or pharmacy?: 1 - Never  Interpreter Needed?: No  Information entered by :: Lanier Ensign, LPN   Activities of Daily Living    05/08/2023   11:00 AM 05/19/2022    1:25 PM  In your present state of health, do you have any difficulty performing the following activities:  Hearing? 0 0  Vision? 0 0  Difficulty concentrating or making decisions? 0 0  Walking or climbing stairs? 0 0  Dressing or bathing? 0 0  Doing errands, shopping? 0 0  Preparing Food and eating ? N N  Using the Toilet? N N  In the past six months, have you accidently leaked urine? N N  Do you have problems with loss of bowel control? N N  Managing your Medications? N N  Managing your Finances? N N  Housekeeping or managing your Housekeeping? N N    Patient Care Team: Jarold Motto, [place] as PCP - General (Physician Assistant) Ernesto Rutherford, MD as Consulting Physician (Ophthalmology)  Indicate any recent Medical Services you may have received from other than [facility] providers in the past year (date may be approximate).     Assessment:   This is a routine wellness examination for Ricardo Dawson.  Hearing/Vision screen Hearing Screening - Comments:: Pt denies any hearing issues  Vision Screening - Comments:: Pt follows up with Dr Dione Booze   Dietary issues and exercise activities discussed:     Goals Addressed             This Visit's Progress    Patient Stated        Stay healthy        Depression Screen    05/08/2023   10:58 AM 05/19/2022    1:23 PM 02/13/2022    1:24 PM 12/09/2020   10:45 AM 03/12/2020    1:04 PM 01/22/2015    8:31 AM  PHQ 2/9 Scores  PHQ - 2 Score 0 0 0 0 0 0    Fall Risk  05/08/2023   11:00 AM 05/19/2022    1:25 PM 02/13/2022    1:26 PM 12/09/2020   10:48 AM 03/12/2020    1:04 PM  Fall Risk   Falls in the past year? 0 0 0 0 0  Number falls in past yr: 0 0 0 1   Comment    on ice shoveling snow   Injury with Fall? 0 0 0 0   Risk for fall due to : Impaired vision Impaired vision No Fall Risks Impaired vision   Follow up Falls prevention discussed Falls prevention discussed Falls evaluation completed Falls prevention discussed     MEDICARE RISK AT HOME:  Medicare Risk at Home - 05/08/23 1101     Any stairs in or around the home? Yes    If so, are there any without handrails? No    Home free of loose throw rugs in walkways, pet beds, electrical cords, etc? Yes    Adequate lighting in your home to reduce risk of falls? Yes    Life alert? No    Use of a cane, walker or w/c? No    Grab bars in the bathroom? Yes    Shower chair or bench in shower? Yes    Elevated toilet seat or a handicapped toilet? No             TIMED UP AND GO:  Was the test performed?  Yes  Length of time to ambulate 10 feet: 10 sec Gait steady and fast without use of assistive device    Cognitive Function:        05/08/2023   11:01 AM 05/19/2022    1:26 PM 12/09/2020   11:04 AM  6CIT Screen  What Year? 0 points 0 points 0 points  What month? 0 points 0 points 0 points  What time? 0 points 0 points   Count back from 20 0 points 0 points 0 points  Months in reverse 0 points 0 points 0 points  Repeat phrase 0 points 0 points 2 points  Total Score 0 points 0 points     Immunizations Immunization History  Administered Date(s) Administered   Influenza Whole 09/04/2008, 12/09/2009   Influenza, High Dose Seasonal PF 10/06/2015    Influenza,inj,Quad PF,6+ Mos 01/22/2015, 07/26/2016, 10/17/2017   PFIZER(Purple Top)SARS-COV-2 Vaccination 01/18/2020, 02/17/2020   Pneumococcal Conjugate-13 01/22/2015   Pneumococcal Polysaccharide-23 03/06/2011   Td 09/04/2008    TDAP status: Due, Education has been provided regarding the importance of this vaccine. Advised may receive this vaccine at local pharmacy or Health Dept. Aware to provide a copy of the vaccination record if obtained from local pharmacy or Health Dept. Verbalized acceptance and understanding.  Flu Vaccine status: Declined, Education has been provided regarding the importance of this vaccine but patient still declined. Advised may receive this vaccine at local pharmacy or Health Dept. Aware to provide a copy of the vaccination record if obtained from local pharmacy or Health Dept. Verbalized acceptance and understanding.  Pneumococcal vaccine status: Up to date  Covid-19 vaccine status: Completed vaccines  Qualifies for Shingles Vaccine? Yes   Zostavax completed No   Shingrix Completed?: No.    Education has been provided regarding the importance of this vaccine. Patient has been advised to call insurance company to determine out of pocket expense if they have not yet received this vaccine. Advised may also receive vaccine at local pharmacy or Health Dept. Verbalized acceptance and understanding.  Screening Tests Health Maintenance  Topic Date Due  DTaP/Tdap/Td (2 - Tdap) 09/04/2018   Diabetic kidney evaluation - Urine ACR  10/17/2018   COVID-19 Vaccine (3 - 2023-24 season) 07/14/2022   HEMOGLOBIN A1C  08/15/2022   OPHTHALMOLOGY EXAM  11/27/2022   Diabetic kidney evaluation - eGFR measurement  02/15/2023   Zoster Vaccines- Shingrix (1 of 2) 08/08/2023 (Originally 01/06/1995)   INFLUENZA VACCINE  06/14/2023   Medicare Annual Wellness (AWV)  05/07/2024   Pneumonia Vaccine 47+ Years old  Completed   Hepatitis C Screening  Completed   HPV VACCINES  Aged Out    FOOT EXAM  Discontinued   Colonoscopy  Discontinued    Health Maintenance  Health Maintenance Due  Topic Date Due   DTaP/Tdap/Td (2 - Tdap) 09/04/2018   Diabetic kidney evaluation - Urine ACR  10/17/2018   COVID-19 Vaccine (3 - 2023-24 season) 07/14/2022   HEMOGLOBIN A1C  08/15/2022   OPHTHALMOLOGY EXAM  11/27/2022   Diabetic kidney evaluation - eGFR measurement  02/15/2023    Colorectal cancer screening: No longer required.    Additional Screening:  Hepatitis C Screening: Completed 10/17/17  Vision Screening: Recommended annual ophthalmology exams for early detection of glaucoma and other disorders of the eye. Is the patient up to date with their annual eye exam?  Yes  Who is the provider or what is the name of the office in which the patient attends annual eye exams? Dr Dione Booze  If pt is not established with a provider, would they like to be referred to a provider to establish care? No .   Dental Screening: Recommended annual dental exams for proper oral hygiene  Diabetic Foot Exam: Diabetic Foot Exam: Overdue, Pt has been advised about the importance in completing this exam. Pt is scheduled for diabetic foot exam on next appt .  Community Resource Referral / Chronic Care Management: CRR required this visit?  No   CCM required this visit?  No     Plan:     I have personally reviewed and noted the following in the patient's chart:   Medical and social history Use of alcohol, tobacco or illicit drugs  Current medications and supplements including opioid prescriptions. Patient is not currently taking opioid prescriptions. Functional ability and status Nutritional status Physical activity Advanced directives List of other physicians Hospitalizations, surgeries, and ER visits in previous 12 months Vitals Screenings to include cognitive, depression, and falls Referrals and appointments  In addition, I have reviewed and discussed with patient certain preventive  protocols, quality metrics, and best practice recommendations. A written personalized care plan for preventive services as well as general preventive health recommendations were provided to patient.     Marzella Schlein, LPN   9/62/[id number]   After Visit Summary: (MyChart) Due to this being a telephonic visit, the after visit summary with patients personalized plan was offered to patient via MyChart   Nurse Notes: none

## 2023-05-08 NOTE — Patient Instructions (Addendum)
Ricardo Dawson , Thank you for taking time to come for your Medicare Wellness Visit. I appreciate your ongoing commitment to your health goals. Please review the following plan we discussed and let me know if I can assist you in the future.   These are the goals we discussed:  Goals      Patient Stated     Stay healthy     Patient Stated     Stay healthy      Patient Stated     Stay healthy         This is a list of the screening recommended for you and due dates:  Health Maintenance  Topic Date Due   Zoster (Shingles) Vaccine (1 of 2) Never done   DTaP/Tdap/Td vaccine (2 - Tdap) 09/04/2018   Yearly kidney health urinalysis for diabetes  10/17/2018   COVID-19 Vaccine (3 - 2023-24 season) 07/14/2022   Hemoglobin A1C  08/15/2022   Eye exam for diabetics  11/27/2022   Yearly kidney function blood test for diabetes  02/15/2023   Flu Shot  06/14/2023   Medicare Annual Wellness Visit  05/07/2024   Pneumonia Vaccine  Completed   Hepatitis C Screening  Completed   HPV Vaccine  Aged Out   Complete foot exam   Discontinued   Colon Cancer Screening  Discontinued    Advanced directives: Advance directive discussed with you today. Even though you declined this today please call our office should you change your mind and we can give you the proper paperwork for you to fill out.  Conditions/risks identified: stay healthy   Next appointment: Follow up in one year for your annual wellness visit.   Preventive Care 72 Years and Older, Male  Preventive care refers to lifestyle choices and visits with your health care provider that can promote health and wellness. What does preventive care include? A yearly physical exam. This is also called an annual well check. Dental exams once or twice a year. Routine eye exams. Ask your health care provider how often you should have your eyes checked. Personal lifestyle choices, including: Daily care of your teeth and gums. Regular physical  activity. Eating a healthy diet. Avoiding tobacco and drug use. Limiting alcohol use. Practicing safe sex. Taking low doses of aspirin every day. Taking vitamin and mineral supplements as recommended by your health care provider. What happens during an annual well check? The services and screenings done by your health care provider during your annual well check will depend on your age, overall health, lifestyle risk factors, and family history of disease. Counseling  Your health care provider may ask you questions about your: Alcohol use. Tobacco use. Drug use. Emotional well-being. Home and relationship well-being. Sexual activity. Eating habits. History of falls. Memory and ability to understand (cognition). Work and work Astronomer. Screening  You may have the following tests or measurements: Height, weight, and BMI. Blood pressure. Lipid and cholesterol levels. These may be checked every 5 years, or more frequently if you are over 80 years old. Skin check. Lung cancer screening. You may have this screening every year starting at age 19 if you have a 30-pack-year history of smoking and currently smoke or have quit within the past 15 years. Fecal occult blood test (FOBT) of the stool. You may have this test every year starting at age 25. Flexible sigmoidoscopy or colonoscopy. You may have a sigmoidoscopy every 5 years or a colonoscopy every 10 years starting at age 48. Prostate cancer screening.  Recommendations will vary depending on your family history and other risks. Hepatitis C blood test. Hepatitis B blood test. Sexually transmitted disease (STD) testing. Diabetes screening. This is done by checking your blood sugar (glucose) after you have not eaten for a while (fasting). You may have this done every 1-3 years. Abdominal aortic aneurysm (AAA) screening. You may need this if you are a current or former smoker. Osteoporosis. You may be screened starting at age 91 if you are  at high risk. Talk with your health care provider about your test results, treatment options, and if necessary, the need for more tests. Vaccines  Your health care provider may recommend certain vaccines, such as: Influenza vaccine. This is recommended every year. Tetanus, diphtheria, and acellular pertussis (Tdap, Td) vaccine. You may need a Td booster every 10 years. Zoster vaccine. You may need this after age 31. Pneumococcal 13-valent conjugate (PCV13) vaccine. One dose is recommended after age 47. Pneumococcal polysaccharide (PPSV23) vaccine. One dose is recommended after age 46. Talk to your health care provider about which screenings and vaccines you need and how often you need them. This information is not intended to replace advice given to you by your health care provider. Make sure you discuss any questions you have with your health care provider. Document Released: 11/26/2015 Document Revised: 07/19/2016 Document Reviewed: 08/31/2015 Elsevier Interactive Patient Education  2017 [employer] Prevention in the Home Falls can cause injuries. They can happen to people of all ages. There are many things you can do to make your home safe and to help prevent falls. What can I do on the outside of my home? Regularly fix the edges of walkways and driveways and fix any cracks. Remove anything that might make you trip as you walk through a door, such as a raised step or threshold. Trim any bushes or trees on the path to your home. Use bright outdoor lighting. Clear any walking paths of anything that might make someone trip, such as rocks or tools. Regularly check to see if handrails are loose or broken. Make sure that both sides of any steps have handrails. Any raised decks and porches should have guardrails on the edges. Have any leaves, snow, or ice cleared regularly. Use sand or salt on walking paths during winter. Clean up any spills in your garage right away. This includes oil  or grease spills. What can I do in the bathroom? Use night lights. Install grab bars by the toilet and in the tub and shower. Do not use towel bars as grab bars. Use non-skid mats or decals in the tub or shower. If you need to sit down in the shower, use a plastic, non-slip stool. Keep the floor dry. Clean up any water that spills on the floor as soon as it happens. Remove soap buildup in the tub or shower regularly. Attach bath mats securely with double-sided non-slip rug tape. Do not have throw rugs and other things on the floor that can make you trip. What can I do in the bedroom? Use night lights. Make sure that you have a light by your bed that is easy to reach. Do not use any sheets or blankets that are too big for your bed. They should not hang down onto the floor. Have a firm chair that has side arms. You can use this for support while you get dressed. Do not have throw rugs and other things on the floor that can make you trip. What can I  do in the kitchen? Clean up any spills right away. Avoid walking on wet floors. Keep items that you use a lot in easy-to-reach places. If you need to reach something above you, use a strong step stool that has a grab bar. Keep electrical cords out of the way. Do not use floor polish or wax that makes floors slippery. If you must use wax, use non-skid floor wax. Do not have throw rugs and other things on the floor that can make you trip. What can I do with my stairs? Do not leave any items on the stairs. Make sure that there are handrails on both sides of the stairs and use them. Fix handrails that are broken or loose. Make sure that handrails are as long as the stairways. Check any carpeting to make sure that it is firmly attached to the stairs. Fix any carpet that is loose or worn. Avoid having throw rugs at the top or bottom of the stairs. If you do have throw rugs, attach them to the floor with carpet tape. Make sure that you have a light  switch at the top of the stairs and the bottom of the stairs. If you do not have them, ask someone to add them for you. What else can I do to help prevent falls? Wear shoes that: Do not have high heels. Have rubber bottoms. Are comfortable and fit you well. Are closed at the toe. Do not wear sandals. If you use a stepladder: Make sure that it is fully opened. Do not climb a closed stepladder. Make sure that both sides of the stepladder are locked into place. Ask someone to hold it for you, if possible. Clearly mark and make sure that you can see: Any grab bars or handrails. First and last steps. Where the edge of each step is. Use tools that help you move around (mobility aids) if they are needed. These include: Canes. Walkers. Scooters. Crutches. Turn on the lights when you go into a dark area. Replace any light bulbs as soon as they burn out. Set up your furniture so you have a clear path. Avoid moving your furniture around. If any of your floors are uneven, fix them. If there are any pets around you, be aware of where they are. Review your medicines with your doctor. Some medicines can make you feel dizzy. This can increase your chance of falling. Ask your doctor what other things that you can do to help prevent falls. This information is not intended to replace advice given to you by your health care provider. Make sure you discuss any questions you have with your health care provider. Document Released: 08/26/2009 Document Revised: 04/06/2016 Document Reviewed: 12/04/2014 Elsevier Interactive Patient Education  2017 [employer].

## 2023-05-15 ENCOUNTER — Other Ambulatory Visit: Payer: Self-pay | Admitting: Physician Assistant

## 2023-08-14 ENCOUNTER — Other Ambulatory Visit: Payer: Self-pay | Admitting: Physician Assistant

## 2023-11-12 ENCOUNTER — Other Ambulatory Visit: Payer: Self-pay | Admitting: Physician Assistant

## 2023-11-12 NOTE — Telephone Encounter (Signed)
Copied from CRM [id number]. Topic: Clinical - Medication Refill >> Nov 12, 2023 12:27 PM Irine Seal wrote: Most Recent Primary Care Visit:  Provider: Marzella Schlein  Department: [facility]  Visit Type: MEDICARE AWV, SEQUENTIAL  Date: 05/08/2023  Medication: folic acid (FOLVITE) 1 MG tablet simvastatin (ZOCOR) 10 MG tablet  losartan-hydrochlorothiazide (HYZAAR) 100-25 MG tablet  Has the patient contacted their pharmacy? Yes (Agent: If no, request that the patient contact the pharmacy for the refill. If patient does not wish to contact the pharmacy document the reason why and proceed with request.) (Agent: If yes, when and what did the pharmacy advise?)  Is this the correct pharmacy for this prescription? Yes If no, delete pharmacy and type the correct one.  This is the patient's preferred pharmacy:  [facility] [address] ([address] [place] - [address] [place] ([place]) [place] [address] Phone: [phone] Fax: [phone]   Has the prescription been filled recently? Yes  Is the patient out of the medication? Yes  Has the patient been seen for an appointment in the last year OR does the patient have an upcoming appointment? Yes  Can we respond through MyChart? Yes  Agent: Please be advised that Rx refills may take up to 3 business days. We ask that you follow-up with your pharmacy.

## 2023-12-11 ENCOUNTER — Other Ambulatory Visit: Payer: Self-pay | Admitting: Physician Assistant

## 2023-12-12 ENCOUNTER — Encounter: Payer: Self-pay | Admitting: Physician Assistant

## 2023-12-12 ENCOUNTER — Ambulatory Visit (INDEPENDENT_AMBULATORY_CARE_PROVIDER_SITE_OTHER): Payer: Medicare PPO | Admitting: Physician Assistant

## 2023-12-12 ENCOUNTER — Other Ambulatory Visit: Payer: Self-pay | Admitting: Physician Assistant

## 2023-12-12 VITALS — BP 156/80 | HR 105 | Temp 98.0°F | Ht 68.0 in | Wt 155.2 lb

## 2023-12-12 DIAGNOSIS — E1159 Type 2 diabetes mellitus with other circulatory complications: Secondary | ICD-10-CM

## 2023-12-12 DIAGNOSIS — F17290 Nicotine dependence, other tobacco product, uncomplicated: Secondary | ICD-10-CM

## 2023-12-12 DIAGNOSIS — Z23 Encounter for immunization: Secondary | ICD-10-CM | POA: Diagnosis not present

## 2023-12-12 DIAGNOSIS — E785 Hyperlipidemia, unspecified: Secondary | ICD-10-CM | POA: Diagnosis not present

## 2023-12-12 DIAGNOSIS — D649 Anemia, unspecified: Secondary | ICD-10-CM

## 2023-12-12 DIAGNOSIS — E119 Type 2 diabetes mellitus without complications: Secondary | ICD-10-CM

## 2023-12-12 DIAGNOSIS — I152 Hypertension secondary to endocrine disorders: Secondary | ICD-10-CM | POA: Diagnosis not present

## 2023-12-12 DIAGNOSIS — E1169 Type 2 diabetes mellitus with other specified complication: Secondary | ICD-10-CM | POA: Diagnosis not present

## 2023-12-12 NOTE — Patient Instructions (Signed)
It was great to see you!  We will refill your medications today  We will update your blood work and urine today  Your blood pressure is elevated in our office today.  I recommend that you monitor this at home.  Your goal blood pressure should be around < 130/80, unless you are over 79 years old, your goal may be closer to 140-150/90. Please note if you have been given other goals from a cardiologist or other healthcare provider, please defer to their recommendations.  When preparing to take your blood pressure: Plan ahead. Don't smoke, drink caffeine or exercise within 30 minutes before taking your blood pressure. Empty your bladder. Don't take the measurement over clothes. Remove the clothing over the arm that will be used to measure blood pressure. You can use either arm unless otherwise told by a healthcare provider. Usually there is not a big difference between readings on them. Be still. Allow at least five minutes of quiet rest before measurements. Don't talk or use the phone. Sit correctly. Sit with your back straight and supported (on a dining chair, rather than a sofa). Your feet should be flat on the floor. Do not cross your legs. Support your arm on a flat surface. The middle of the cuff should be placed on the upper arm at heart level.  Measure at the same time of the day. Take multiple readings and record the results. Each time you measure, take two readings one minute apart. Record the results and bring in to your next office visit.  In order to know how well the medication is working, I would like you to take your readings 1-2 hours after taking your blood pressure medication if possible. Take your blood pressure measurements and record 2-3 days per week.  If you get a high blood pressure reading: A single high reading is not an immediate cause for alarm. If you get a reading that is higher than normal, take your blood pressure a second time. Write down the results of both  measurements. Check with your health care professional to see if there's a health concern or whether there may be problems with your monitor. If your blood pressure readings are suddenly higher than 180/120 mm Hg, wait at least one minute and test again. If your readings are still very high, contact your health care professional immediately. You could be having a hypertensive crisis. Call 911 if your blood pressure is higher than 180/120 mm Hg and if you are having new signs or symptoms that may include: Chest pain Shortness of breath Back pain Numbness Weakness Change in vision Difficulty speaking Confusion Dizziness Vomiting       Let's follow-up in 1 year, sooner if you have concerns.  Take care,  Jarold Motto PA-C

## 2023-12-12 NOTE — Progress Notes (Signed)
Ricardo Dawson is a 78 y.o. male here for a follow up of a pre-existing problem.  History of Present Illness:   Chief Complaint  Patient presents with   Medication Refill   Anemia  He denies following up with gastroenterology following hospitalization episode due to anemia in 2023.  Denies any rectal bleeding, blood in stool, and other GI symptoms.  Diabetes  Overdue for follow up. Current DM meds: none -- previously on metformin but discontinued due to well controlled levels. Blood sugars at home are: never checked. Patient is compliant with medications. Denies: hypoglycemic or hyperglycemic episodes or symptoms.   Lab Results  Component Value Date   HGBA1C 5.9 02/13/2022   HTN Currently taking losartan-hctz 100-25 mg. At home blood pressure readings are: not checked. Patient denies chest pain, SOB, blurred vision, dizziness, unusual headaches, lower leg swelling. Patient is compliant with medication. Denies excessive caffeine intake, stimulant usage, excessive alcohol intake, or increase in salt consumption.  BP Readings from Last 3 Encounters:  12/12/23 (!) 156/80  05/08/23 126/78  02/22/22 118/70    Cigar smoking He reports starting smoking in his 30s. Continues to smoke. He typically smokes cigars.  Denies any SOB or chest pain.  Past Medical History:  Diagnosis Date   Diabetes mellitus    Type II   Hyperlipidemia    Hypertension     HLD Currently taking simvastatin 10 mg daily. Denies concerns.    Social History   Tobacco Use   Smoking status: Every Day    Types: Cigars    Start date: 1970   Smokeless tobacco: Never  Vaping Use   Vaping status: Never Used  Substance Use Topics   Alcohol use: Yes    Comment: 4   Drug use: No    No past surgical history on file.  Family History  Problem Relation Age of Onset   Cancer Neg Hx    Colon cancer Neg Hx    Stomach cancer Neg Hx    Pancreatic cancer Neg Hx     Allergies  Allergen Reactions   Ace  Inhibitors Other (See Comments) and Cough    Cough 2002--altace    Current Medications:   Current Outpatient Medications:    aspirin 81 MG tablet, Take 81 mg by mouth daily., Disp: , Rfl:    Cyanocobalamin (B-12) 1000 MCG CAPS, Take 1 capsule by mouth daily., Disp: 90 capsule, Rfl: 3   folic acid (FOLVITE) 1 MG tablet, Take 1 tablet by mouth once daily, Disp: 30 tablet, Rfl: 0   losartan-hydrochlorothiazide (HYZAAR) 100-25 MG tablet, Take 1 tablet by mouth once daily, Disp: 30 tablet, Rfl: 0   Multiple Vitamin (MULTIVITAMIN) tablet, Take 1 tablet by mouth daily with breakfast., Disp: , Rfl:    simvastatin (ZOCOR) 10 MG tablet, Take 1 tablet by mouth once daily, Disp: 30 tablet, Rfl: 0   Review of Systems:   Review of Systems  Respiratory:  Negative for shortness of breath.   Cardiovascular:  Negative for chest pain.  Gastrointestinal: Negative.  Negative for blood in stool.    Vitals:   Vitals:   12/12/23 1440 12/12/23 1457  BP: (!) 160/80 (!) 156/80  Pulse: (!) 105   Temp: 98 F (36.7 C)   TempSrc: Oral   SpO2: 98%   Weight: 155 lb 4 oz (70.4 kg)   Height: 5\' 8"  (1.727 m)      Body mass index is 23.61 kg/m.  Physical Exam:   Physical Exam Vitals  and nursing note reviewed.  Constitutional:      General: He is not in acute distress.    Appearance: He is well-developed. He is not ill-appearing or toxic-appearing.  Cardiovascular:     Rate and Rhythm: Regular rhythm. Tachycardia present.     Pulses: Normal pulses.     Heart sounds: Normal heart sounds, S1 normal and S2 normal.  Pulmonary:     Effort: Pulmonary effort is normal.     Breath sounds: Normal breath sounds.  Skin:    General: Skin is warm and dry.  Neurological:     Mental Status: He is alert.     GCS: GCS eye subscore is 4. GCS verbal subscore is 5. GCS motor subscore is 6.  Psychiatric:        Speech: Speech normal.        Behavior: Behavior normal. Behavior is cooperative.     Assessment and  Plan:   Anemia, unspecified type Unfortunately he never followed up with gastroenterology and has been lost to follow-up with [place] for at least two years Refusing colonoscopy or colon cancer screening Will recheck blood work today and advise further  Type 2 diabetes mellitus without complication, without long-term current use of insulin (HCC) Update A1c and advise accordingly Encouraged regular yearly exams at a minimum  Hyperlipidemia associated with type 2 diabetes mellitus (HCC) Update lipid panel and adjust simvastatin 10 mg daily Encouraged regular yearly exams at a minimum  Hypertension associated with diabetes (HCC) Above goal today No evidence of end-organ damage on my exam Recommend patient monitor home blood pressure at least a few times weekly Refill losartan-hydrochloroTHIAZIDE 100-25 mg daily If home monitoring shows consistent elevation, or any symptom(s) develop, recommend reach out to [place] for further advice on next steps Encouraged regular yearly exams at a minimum  Cigar smoker Recommend cessation Unfortunately lung cancer screening does not cover for cigar use  Encounter for immunization Flu shot completed today  Jarold Motto, PA-C  I,Safa M Kadhim,acting as a scribe for [employer], PA.,have documented all relevant documentation on the behalf of Jarold Motto, PA,as directed by  Jarold Motto, PA while in the presence of Jarold Motto, [place].   I, Jarold Motto, [place], have reviewed all documentation for this visit. The documentation on 12/12/23 for the exam, diagnosis, procedures, and orders are all accurate and complete.

## 2023-12-13 ENCOUNTER — Telehealth: Payer: Self-pay | Admitting: *Deleted

## 2023-12-13 LAB — COMPREHENSIVE METABOLIC PANEL
ALT: 14 U/L (ref 0–53)
AST: 24 U/L (ref 0–37)
Albumin: 4.3 g/dL (ref 3.5–5.2)
Alkaline Phosphatase: 56 U/L (ref 39–117)
BUN: 12 mg/dL (ref 6–23)
CO2: 24 meq/L (ref 19–32)
Calcium: 9.6 mg/dL (ref 8.4–10.5)
Chloride: 106 meq/L (ref 96–112)
Creatinine, Ser: 1.34 mg/dL (ref 0.40–1.50)
GFR: 50.59 mL/min — ABNORMAL LOW (ref >60.00)
Glucose, Bld: 168 mg/dL — ABNORMAL HIGH (ref 70–99)
Potassium: 3.5 meq/L (ref 3.5–5.1)
Sodium: 142 meq/L (ref 135–145)
Total Bilirubin: 0.4 mg/dL (ref 0.2–1.2)
Total Protein: 7.1 g/dL (ref 6.0–8.3)

## 2023-12-13 LAB — CBC WITH DIFFERENTIAL/PLATELET
Basophils Absolute: 0.1 10*3/uL (ref 0.0–0.1)
Basophils Relative: 1.1 % (ref 0.0–3.0)
Eosinophils Absolute: 0 10*3/uL (ref 0.0–0.7)
Eosinophils Relative: 0.3 % (ref 0.0–5.0)
HCT: 36.8 % — ABNORMAL LOW (ref 39.0–52.0)
Hemoglobin: 12.3 g/dL — ABNORMAL LOW (ref 13.0–17.0)
Lymphocytes Relative: 21.8 % (ref 12.0–46.0)
Lymphs Abs: 1.2 10*3/uL (ref 0.7–4.0)
MCHC: 33.4 g/dL (ref 30.0–36.0)
MCV: 91.9 fL (ref 78.0–100.0)
Monocytes Absolute: 0.5 10*3/uL (ref 0.1–1.0)
Monocytes Relative: 9.5 % (ref 3.0–12.0)
Neutro Abs: 3.6 10*3/uL (ref 1.4–7.7)
Neutrophils Relative %: 67.3 % (ref 43.0–77.0)
Platelets: 263 10*3/uL (ref 150.0–400.0)
RBC: 4 Mil/uL — ABNORMAL LOW (ref 4.22–5.81)
RDW: 14.3 % (ref 11.5–15.5)
WBC: 5.4 10*3/uL (ref 4.0–10.5)

## 2023-12-13 LAB — LIPID PANEL
Cholesterol: 123 mg/dL (ref 0–200)
HDL: 69.1 mg/dL (ref >39.00)
LDL Cholesterol: 39 mg/dL (ref 0–99)
NonHDL: 53.83
Total CHOL/HDL Ratio: 2
Triglycerides: 73 mg/dL (ref 0.0–149.0)
VLDL: 14.6 mg/dL (ref 0.0–40.0)

## 2023-12-13 LAB — IBC + FERRITIN
Ferritin: 27.7 ng/mL (ref 22.0–322.0)
Iron: 73 ug/dL (ref 42–165)
Saturation Ratios: 18 % — ABNORMAL LOW (ref 20.0–50.0)
TIBC: 404.6 ug/dL (ref 250.0–450.0)
Transferrin: 289 mg/dL (ref 212.0–360.0)

## 2023-12-13 LAB — HEMOGLOBIN A1C: Hgb A1c MFr Bld: 6.9 % — ABNORMAL HIGH (ref 4.6–6.5)

## 2023-12-13 LAB — MICROALBUMIN / CREATININE URINE RATIO
Creatinine,U: 136 mg/dL
Microalb Creat Ratio: 1.8 mg/g (ref 0.0–30.0)
Microalb, Ur: 2.4 mg/dL — ABNORMAL HIGH (ref 0.0–1.9)

## 2023-12-13 NOTE — Telephone Encounter (Signed)
Copied from CRM [id number]. Topic: Clinical - Medication Refill >> Dec 12, 2023  5:04 PM Aletta Edouard wrote: Most Recent Primary Care Visit:  Provider: Jarold Motto  Department: [facility]  Visit Type: OFFICE VISIT  Date: 12/12/2023  Medication: osartan-hydrochlorothiazide (HYZAAR) 100-25 MG tablet [  folic acid (FOLVITE) 1 MG tablet    losartan-hydrochlorothiazide (HYZAAR) 100-25 MG tablet    (Agent: If no, request that the patient contact the pharmacy for the refill. If patient does not wish to contact the pharmacy document the reason why and proceed with request.) (Agent: If yes, when and what did the pharmacy advise?)  Is this the correct pharmacy for this prescription? Yes If no, delete pharmacy and type the correct one.  This is the patient's preferred pharmacy:  [facility] [address] ([address]), [place] - [address] [place] ([place] [address] Phone: [phone] Fax: [phone]   Has the prescription been filled recently? No  Is the patient out of the medication? Yes  Has the patient been seen for an appointment in the last year OR does the patient have an upcoming appointment? Yes  Can we respond through MyChart? No  Agent: Please be advised that Rx refills may take up to 3 business days. We ask that you follow-up with your pharmacy.

## 2023-12-13 NOTE — Telephone Encounter (Signed)
Tried to contact patient no answer, Rx's were sent to pharmacy.

## 2023-12-13 NOTE — Telephone Encounter (Signed)
Duplice, Rx's sent.

## 2023-12-13 NOTE — Telephone Encounter (Signed)
Spoke to pt told him all Rx's were sent to pharmacy early this morning. Pt verbalized understanding.

## 2023-12-13 NOTE — Telephone Encounter (Signed)
Copied from CRM [id number]. Topic: General - Other >> Dec 12, 2023  5:06 PM Ricardo Dawson wrote: Reason for CRM: PATIENT IS NEEDING HIS MEDICATIONS CALLED IN TODAY HE IS OUT OF THE MEDICATION

## 2023-12-14 ENCOUNTER — Other Ambulatory Visit: Payer: Self-pay | Admitting: *Deleted

## 2023-12-14 DIAGNOSIS — D649 Anemia, unspecified: Secondary | ICD-10-CM

## 2023-12-14 MED ORDER — EMPAGLIFLOZIN 10 MG PO TABS: 10.0000 mg | ORAL_TABLET | Freq: Every day | ORAL | 5 refills | Status: DC

## 2023-12-17 ENCOUNTER — Telehealth: Payer: Self-pay | Admitting: Physician Assistant

## 2023-12-17 ENCOUNTER — Telehealth: Payer: Self-pay | Admitting: *Deleted

## 2023-12-17 NOTE — Telephone Encounter (Signed)
Tried to contact pt, no answer, let him know we will get back to him once I speak to the pharmacy.

## 2023-12-17 NOTE — Telephone Encounter (Signed)
Patient is checking in and requesting a call back.   Copied from CRM [id number]. Topic: Clinical - Medication Question >> Dec 17, 2023 11:04 AM Clayton Bibles wrote: Reason for CRM: Saajan is calling back about empagliflozin (JARDIANCE) 10 MG TABS. A CRM was completed on 12/14/23. I let Keontay know that in his notes, it shows they are working on the medication question. He wants to see it he can get another medication that insurance will cover.

## 2023-12-17 NOTE — Telephone Encounter (Signed)
Copied from CRM [id number]. Topic: Clinical - Prescription Issue >> Dec 14, 2023  4:28 PM Leavy Cella D wrote: Reason for CRM: Patient stated he was prescribed new medication empagliflozin (JARDIANCE) 10 MG TABS tablet . Patient stated that he went to pharmacy to pick up medication and was told it was $400. Patient would like to speak with nurse regarding medication

## 2023-12-17 NOTE — Telephone Encounter (Signed)
Ricardo Dawson, pt called Jardiance is $400.00 too expensive. Please advise.

## 2023-12-18 NOTE — Telephone Encounter (Signed)
Called [facility] pharmacy spoke to pharmacist, asked her if she could tell me what SGLT2 is covered under patients plan and alternative for Jardiance. She said that they can not tell me what is covered until we order the medication. Told her okay.

## 2023-12-18 NOTE — Telephone Encounter (Signed)
Samantha, I looked up oral medications for diabetes that are covered under [facility]. Tier 1 medications are Glimepiride and Pioglitazone.

## 2023-12-19 ENCOUNTER — Telehealth: Payer: Self-pay | Admitting: *Deleted

## 2023-12-19 MED ORDER — METFORMIN HCL 500 MG PO TABS: 500.0000 mg | ORAL_TABLET | Freq: Every day | ORAL | 2 refills | Status: DC

## 2023-12-19 NOTE — Telephone Encounter (Signed)
 Spoke to pt told him new Rx for Metformin  500 mg daily was sent to pharmacy today and Rx for Jardiance  was cancelled. Pt verbalized understanding.

## 2023-12-19 NOTE — Addendum Note (Signed)
 Addended by: Winona Haw on: 12/19/2023 11:53 AM   Modules accepted: Orders

## 2023-12-19 NOTE — Telephone Encounter (Signed)
 Tried to contact pt no answer. If pt calls back tell hm sending new Rx to pharmacy for Metformin  500 mg one tablet daily.

## 2023-12-19 NOTE — Telephone Encounter (Signed)
 Copied from CRM [id number]. Topic: Clinical - Prescription Issue >> Dec 19, 2023 12:22 PM Ricardo Dawson wrote: Reason for CRM: Patient called and said he went to [facility] to pick up the blood sugar pill and it's 400+ for a month supply. Requesting a call back.

## 2023-12-19 NOTE — Telephone Encounter (Signed)
 See other message

## 2024-03-15 ENCOUNTER — Other Ambulatory Visit: Payer: Self-pay | Admitting: Physician Assistant

## 2024-04-04 ENCOUNTER — Other Ambulatory Visit: Payer: Self-pay | Admitting: Physician Assistant

## 2024-04-09 ENCOUNTER — Other Ambulatory Visit: Payer: Self-pay | Admitting: *Deleted

## 2024-04-09 MED ORDER — FOLIC ACID 1 MG PO TABS
1.0000 mg | ORAL_TABLET | Freq: Every day | ORAL | 1 refills | Status: DC
Start: 2024-04-09 — End: 2024-08-12

## 2024-05-19 ENCOUNTER — Ambulatory Visit: Payer: Medicare PPO

## 2024-07-24 ENCOUNTER — Ambulatory Visit (INDEPENDENT_AMBULATORY_CARE_PROVIDER_SITE_OTHER)

## 2024-07-24 VITALS — Ht 68.0 in | Wt 155.0 lb

## 2024-07-24 DIAGNOSIS — I152 Hypertension secondary to endocrine disorders: Secondary | ICD-10-CM

## 2024-07-24 DIAGNOSIS — Z0000 Encounter for general adult medical examination without abnormal findings: Secondary | ICD-10-CM | POA: Diagnosis not present

## 2024-07-24 DIAGNOSIS — E1159 Type 2 diabetes mellitus with other circulatory complications: Secondary | ICD-10-CM

## 2024-07-24 NOTE — Patient Instructions (Signed)
 [name],  Thank you for taking the time for your Medicare Wellness Visit. I appreciate your continued commitment to your health goals. Please review the care plan we discussed, and feel free to reach out if I can assist you further.  Medicare recommends these wellness visits once per year to help you and your care team stay ahead of potential health issues. These visits are designed to focus on prevention, allowing your provider to concentrate on managing your acute and chronic conditions during your regular appointments.  Please note that Annual Wellness Visits do not include a physical exam. Some assessments may be limited, especially if the visit was conducted virtually. If needed, we may recommend a separate in-person follow-up with your provider.  Ongoing Care Seeing your primary care provider every 3 to 6 months helps us  monitor your health and provide consistent, personalized care.   Referrals If a referral was made during today's visit and you haven't received any updates within two weeks, please contact the referred provider directly to check on the status.  Recommended Screenings:  Health Maintenance  Topic Date Due   Yearly kidney health urinalysis for diabetes  12/09/2010   Eye exam for diabetics  11/27/2022   Medicare Annual Wellness Visit  05/07/2024   Hemoglobin A1C  06/10/2024   Flu Shot  06/13/2024   COVID-19 Vaccine (3 - 2025-26 season) 07/14/2024   DTaP/Tdap/Td vaccine (2 - Tdap) 12/11/2024*   Zoster (Shingles) Vaccine (1 of 2) 12/11/2024*   Yearly kidney function blood test for diabetes  12/11/2024   Pneumococcal Vaccine for age over 5  Completed   Hepatitis C Screening  Completed   HPV Vaccine  Aged Out   Meningitis B Vaccine  Aged Out   Complete foot exam   Discontinued   Colon Cancer Screening  Discontinued  *Topic was postponed. The date shown is not the original due date.       07/24/2024    1:07 PM  Advanced Directives  Does Patient Have a Medical  Advance Directive? No  Would patient like information on creating a medical advance directive? No - Patient declined   Advance Care Planning is important because it: Ensures you receive medical care that aligns with your values, goals, and preferences. Provides guidance to your family and loved ones, reducing the emotional burden of decision-making during critical moments.  Vision: Annual vision screenings are recommended for early detection of glaucoma, cataracts, and diabetic retinopathy. These exams can also reveal signs of chronic conditions such as diabetes and high blood pressure.  Dental: Annual dental screenings help detect early signs of oral cancer, gum disease, and other conditions linked to overall health, including heart disease and diabetes.  Please see the attached documents for additional preventive care recommendations.

## 2024-07-24 NOTE — Progress Notes (Signed)
 Subjective:   Ricardo Dawson is a 78 y.o. who presents for a Medicare Wellness preventive visit.  As a reminder, Annual Wellness Visits don't include a physical exam, and some assessments may be limited, especially if this visit is performed virtually. We may recommend an in-person follow-up visit with your provider if needed.  Visit Complete: Virtual I connected with  Ricardo Dawson on 07/24/24 by a audio enabled telemedicine application and verified that I am speaking with the correct person using two identifiers.  Patient Location: Home  Provider Location: Office/Clinic  I discussed the limitations of evaluation and management by telemedicine. The patient expressed understanding and agreed to proceed.  Vital Signs: Because this visit was a virtual/telehealth visit, some criteria may be missing or patient reported. Any vitals not documented were not able to be obtained and vitals that have been documented are patient reported.  VideoDeclined- This patient declined Librarian, academic. Therefore the visit was completed with audio only.  Persons Participating in Visit: Patient.  AWV Questionnaire: No: Patient Medicare AWV questionnaire was not completed prior to this visit.  Cardiac Risk Factors include: advanced age (>5men, >16 women);dyslipidemia;diabetes mellitus;male gender;hypertension     Objective:    Today's Vitals   07/24/24 1303  Weight: 155 lb (70.3 kg)  Height: 5' 8 (1.727 m)   Body mass index is 23.57 kg/m.     07/24/2024    1:07 PM 05/08/2023   10:59 AM 05/19/2022    1:24 PM 12/09/2020   10:47 AM 04/17/2018   10:29 AM 04/17/2017   11:30 AM 01/24/2016   11:39 AM  Advanced Directives  Does Patient Have a Medical Advance Directive? No No Yes No No  No  No   Type of Publishing rights manager of Healthcare Power of Attorney in Chart?   No - copy requested      Would patient like information on creating  a medical advance directive? No - Patient declined No - Patient declined  No - Patient declined        Data saved with a previous flowsheet row definition    Current Medications (verified) Outpatient Encounter Medications as of 07/24/2024  Medication Sig   aspirin 81 MG tablet Take 81 mg by mouth daily.   Cyanocobalamin  (B-12) 1000 MCG CAPS Take 1 capsule by mouth daily.   folic acid  (FOLVITE ) 1 MG tablet Take 1 tablet (1 mg total) by mouth daily.   losartan -hydrochlorothiazide  (HYZAAR) 100-25 MG tablet Take 1 tablet by mouth once daily   metFORMIN  (GLUCOPHAGE ) 500 MG tablet Take 1 tablet by mouth once daily with breakfast   Multiple Vitamin (MULTIVITAMIN) tablet Take 1 tablet by mouth daily with breakfast.   simvastatin  (ZOCOR ) 10 MG tablet Take 1 tablet by mouth once daily   No facility-administered encounter medications on file as of 07/24/2024.    Allergies (verified) Ace inhibitors   History: Past Medical History:  Diagnosis Date   Diabetes mellitus    Type II   Hyperlipidemia    Hypertension    History reviewed. No pertinent surgical history. Family History  Problem Relation Age of Onset   Cancer Neg Hx    Colon cancer Neg Hx    Stomach cancer Neg Hx    Pancreatic cancer Neg Hx    Social History   Socioeconomic History   Marital status: Married    Spouse name: Not on file   Number of  children: Not on file   Years of education: Not on file   Highest education level: Not on file  Occupational History   Occupation: retired  Tobacco Use   Smoking status: Every Day    Types: Cigars    Start date: 1970   Smokeless tobacco: Never  Vaping Use   Vaping status: Never Used  Substance and Sexual Activity   Alcohol use: Yes    Comment: 4   Drug use: No   Sexual activity: Yes  Other Topics Concern   Not on file  Social History Narrative   Janitorial -- retired   One son -- lives in [place]    Likes to watch the Gretel is Right; baseball, football   Social Drivers  of Health   Financial Resource Strain: Low Risk  (07/24/2024)   Overall Financial Resource Strain (CARDIA)    Difficulty of Paying Living Expenses: Not hard at all  Food Insecurity: No Food Insecurity (07/24/2024)   Hunger Vital Sign    Worried About Running Out of Food in the Last Year: Never true    Ran Out of Food in the Last Year: Never true  Transportation Needs: No Transportation Needs (07/24/2024)   PRAPARE - Administrator, Civil Service (Medical): No    Lack of Transportation (Non-Medical): No  Physical Activity: Sufficiently Active (07/24/2024)   Exercise Vital Sign    Days of Exercise per Week: 5 days    Minutes of Exercise per Session: 90 min  Stress: No Stress Concern Present (07/24/2024)   [employer] of Occupational Health - Occupational Stress Questionnaire    Feeling of Stress: Not at all  Social Connections: Moderately Integrated (07/24/2024)   Social Connection and Isolation Panel    Frequency of Communication with Friends and Family: More than three times a week    Frequency of Social Gatherings with Friends and Family: More than three times a week    Attends Religious Services: More than 4 times per year    Active Member of [employer] or Organizations: No    Attends Engineer, structural: Never    Marital Status: Married    Tobacco Counseling Ready to quit: Not Answered Counseling given: Not Answered    Clinical Intake:  Pre-visit preparation completed: Yes  Pain : No/denies pain     BMI - recorded: 23.57 Nutritional Status: BMI of 19-24  Normal Nutritional Risks: None Diabetes: Yes CBG done?: No Did pt. bring in CBG monitor from home?: No  Lab Results  Component Value Date   HGBA1C 6.9 (H) 12/12/2023   HGBA1C 5.9 02/13/2022   HGBA1C 5.6 04/21/2020     How often do you need to have someone help you when you read instructions, pamphlets, or other written materials from your doctor or pharmacy?: 1 - Never  Interpreter  Needed?: No  Information entered by :: Ellouise Haws, LPN   Activities of Daily Living     07/24/2024    1:05 PM  In your present state of health, do you have any difficulty performing the following activities:  Hearing? 0  Vision? 0  Difficulty concentrating or making decisions? 0  Walking or climbing stairs? 0  Dressing or bathing? 0  Doing errands, shopping? 0  Preparing Food and eating ? N  Using the Toilet? N  In the past six months, have you accidently leaked urine? N  Do you have problems with loss of bowel control? N  Managing your Medications? N  Managing your Finances?  N  Housekeeping or managing your Housekeeping? N    Patient Care Team: Job Lukes, [place] as PCP - General (Physician Assistant) Octavia Charleston, MD as Consulting Physician (Ophthalmology)  I have updated your Care Teams any recent Medical Services you may have received from other providers in the past year.     Assessment:   This is a routine wellness examination for Ricardo Dawson.  Hearing/Vision screen Hearing Screening - Comments:: Pt denies any hearing issues  Vision Screening - Comments:: Wears rx glasses - up to date with routine eye exams with Dr Octavia    Goals Addressed             This Visit's Progress    Patient Stated       Maintain health and activity        Depression Screen     07/24/2024    1:08 PM 12/12/2023    2:40 PM 05/08/2023   10:58 AM 05/19/2022    1:23 PM 02/13/2022    1:24 PM 12/09/2020   10:45 AM 03/12/2020    1:04 PM  PHQ 2/9 Scores  PHQ - 2 Score 0 0 0 0 0 0 0    Fall Risk     07/24/2024    1:09 PM 05/08/2023   11:00 AM 05/19/2022    1:25 PM 02/13/2022    1:26 PM 12/09/2020   10:48 AM  Fall Risk   Falls in the past year? 0 0 0 0 0  Number falls in past yr: 0 0 0 0 1  Comment     on ice shoveling snow  Injury with Fall? 0 0 0 0 0  Risk for fall due to : No Fall Risks Impaired vision Impaired vision No Fall Risks Impaired vision  Follow up Falls prevention  discussed Falls prevention discussed Falls prevention discussed  Falls evaluation completed  Falls prevention discussed      Data saved with a previous flowsheet row definition    MEDICARE RISK AT HOME:  Medicare Risk at Home Any stairs in or around the home?: Yes If so, are there any without handrails?: Yes Home free of loose throw rugs in walkways, pet beds, electrical cords, etc?: No Adequate lighting in your home to reduce risk of falls?: No Life alert?: Yes Use of a cane, walker or w/c?: Yes Grab bars in the bathroom?: Yes Shower chair or bench in shower?: No Elevated toilet seat or a handicapped toilet?: No  TIMED UP AND GO:  Was the test performed?  No  Cognitive Function: 6CIT completed        07/24/2024    1:09 PM 05/08/2023   11:01 AM 05/19/2022    1:26 PM 12/09/2020   11:04 AM  6CIT Screen  What Year? 0 points 0 points 0 points 0 points  What month? 0 points 0 points 0 points 0 points  What time? 0 points 0 points 0 points   Count back from 20 0 points 0 points 0 points 0 points  Months in reverse 2 points 0 points 0 points 0 points  Repeat phrase 4 points 0 points 0 points 2 points  Total Score 6 points 0 points 0 points     Immunizations Immunization History  Administered Date(s) Administered   Fluad Trivalent(High Dose 65+) 12/12/2023   INFLUENZA, HIGH DOSE SEASONAL PF 10/06/2015   Influenza Whole 09/04/2008, 12/09/2009   Influenza,inj,Quad PF,6+ Mos 01/22/2015, 07/26/2016, 10/17/2017   PFIZER(Purple Top)SARS-COV-2 Vaccination 01/18/2020, 02/17/2020   Pneumococcal Conjugate-13 01/22/2015  Pneumococcal Polysaccharide-23 03/06/2011   Td 09/04/2008    Screening Tests Health Maintenance  Topic Date Due   Diabetic kidney evaluation - Urine ACR  12/09/2010   OPHTHALMOLOGY EXAM  11/27/2022   Medicare Annual Wellness (AWV)  05/07/2024   HEMOGLOBIN A1C  06/10/2024   Influenza Vaccine  06/13/2024   COVID-19 Vaccine (3 - 2025-26 season) 07/14/2024    DTaP/Tdap/Td (2 - Tdap) 12/11/2024 (Originally 09/04/2018)   Zoster Vaccines- Shingrix (1 of 2) 12/11/2024 (Originally 01/06/1995)   Diabetic kidney evaluation - eGFR measurement  12/11/2024   Pneumococcal Vaccine: 50+ Years  Completed   Hepatitis C Screening  Completed   HPV VACCINES  Aged Out   Meningococcal B Vaccine  Aged Out   FOOT EXAM  Discontinued   Colonoscopy  Discontinued    Health Maintenance Items Addressed: See Nurse Notes at the end of this note  Additional Screening:  Vision Screening: Recommended annual ophthalmology exams for early detection of glaucoma and other disorders of the eye. Is the patient up to date with their annual eye exam?  Yes  Who is the provider or what is the name of the office in which the patient attends annual eye exams? Dr Octavia   Dental Screening: Recommended annual dental exams for proper oral hygiene  Community Resource Referral / Chronic Care Management: CRR required this visit?  No   CCM required this visit?  No   Plan:    I have personally reviewed and noted the following in the patient's chart:   Medical and social history Use of alcohol, tobacco or illicit drugs  Current medications and supplements including opioid prescriptions. Patient is not currently taking opioid prescriptions. Functional ability and status Nutritional status Physical activity Advanced directives List of other physicians Hospitalizations, surgeries, and ER visits in previous 12 months Vitals Screenings to include cognitive, depression, and falls Referrals and appointments  In addition, I have reviewed and discussed with patient certain preventive protocols, quality metrics, and best practice recommendations. A written personalized care plan for preventive services as well as general preventive health recommendations were provided to patient.   Ellouise VEAR Haws, LPN   [id number]   After Visit Summary: (MyChart) Due to this being a telephonic visit,  the after visit summary with patients personalized plan was offered to patient via MyChart   Notes: Nothing significant to report at this time.

## 2024-08-12 ENCOUNTER — Other Ambulatory Visit: Payer: Self-pay | Admitting: Physician Assistant

## 2024-09-11 ENCOUNTER — Other Ambulatory Visit: Payer: Self-pay | Admitting: Physician Assistant

## 2024-11-09 ENCOUNTER — Other Ambulatory Visit: Payer: Self-pay | Admitting: Physician Assistant

## 2024-11-15 ENCOUNTER — Other Ambulatory Visit: Payer: Self-pay | Admitting: Physician Assistant

## 2024-11-17 ENCOUNTER — Other Ambulatory Visit: Payer: Self-pay | Admitting: Physician Assistant

## 2024-11-17 NOTE — Telephone Encounter (Signed)
 Copied from CRM #[id number]. Topic: Clinical - Medication Refill >> Nov 17, 2024  2:50 PM Rachelle R wrote: Medication:  simvastatin  (ZOCOR ) 10 MG tablet folic acid  (FOLVITE ) 1 MG tablet losartan -hydrochlorothiazide  (HYZAAR) 100-25 MG tablet  Has the patient contacted their pharmacy? Yes, call dr  This is the patient's preferred pharmacy:  [facility] [address] [place]), [place] - [address] [place]) [place] [address] Phone: [phone] Fax: [phone]  Is this the correct pharmacy for this prescription? Yes  Has the prescription been filled recently? No  Is the patient out of the medication? Yes  Has the patient been seen for an appointment in the last year OR does the patient have an upcoming appointment? Yes  Can we respond through MyChart? No  Agent: Please be advised that Rx refills may take up to 3 business days. We ask that you follow-up with your pharmacy.

## 2024-11-18 MED ORDER — LOSARTAN POTASSIUM-HCTZ 100-25 MG PO TABS: 1.0000 | ORAL_TABLET | Freq: Every day | ORAL | 0 refills | Status: AC

## 2024-11-18 MED ORDER — SIMVASTATIN 10 MG PO TABS: 10.0000 mg | ORAL_TABLET | Freq: Every day | ORAL | 0 refills | Status: AC

## 2024-11-18 MED ORDER — FOLIC ACID 1 MG PO TABS: 1.0000 mg | ORAL_TABLET | Freq: Every day | ORAL | 0 refills | Status: AC

## 2024-11-24 ENCOUNTER — Other Ambulatory Visit: Payer: Self-pay | Admitting: Physician Assistant

## 2025-01-09 ENCOUNTER — Encounter: Admitting: Physician Assistant

## 2025-07-29 ENCOUNTER — Ambulatory Visit
